# Patient Record
Sex: Female | Born: 1970
Health system: Southern US, Community
[De-identification: ages and names within clinical notes are randomized; demographics above are authoritative.]

## PROBLEM LIST (undated history)

## (undated) DIAGNOSIS — Z8669 Personal history of other diseases of the nervous system and sense organs: Secondary | ICD-10-CM

## (undated) DIAGNOSIS — N200 Calculus of kidney: Secondary | ICD-10-CM

## (undated) DIAGNOSIS — I10 Essential (primary) hypertension: Secondary | ICD-10-CM

## (undated) DIAGNOSIS — Z8619 Personal history of other infectious and parasitic diseases: Secondary | ICD-10-CM

## (undated) HISTORY — DX: Calculus of kidney: N20.0

## (undated) HISTORY — DX: Personal history of other infectious and parasitic diseases: Z86.19

## (undated) HISTORY — DX: Personal history of other diseases of the nervous system and sense organs: Z86.69

---

## 1994-02-20 HISTORY — PX: WRIST FRACTURE SURGERY: SHX121

## 2004-07-21 ENCOUNTER — Inpatient Hospital Stay: Payer: Self-pay

## 2004-10-19 ENCOUNTER — Emergency Department: Payer: Self-pay | Admitting: Emergency Medicine

## 2004-10-22 ENCOUNTER — Ambulatory Visit: Payer: Self-pay | Admitting: General Practice

## 2005-01-04 ENCOUNTER — Encounter: Payer: Self-pay | Admitting: General Practice

## 2005-01-20 ENCOUNTER — Encounter: Payer: Self-pay | Admitting: General Practice

## 2005-02-20 ENCOUNTER — Encounter: Payer: Self-pay | Admitting: General Practice

## 2006-01-04 ENCOUNTER — Ambulatory Visit: Payer: Self-pay | Admitting: Internal Medicine

## 2007-11-14 ENCOUNTER — Ambulatory Visit: Payer: Self-pay | Admitting: Internal Medicine

## 2007-11-29 ENCOUNTER — Other Ambulatory Visit: Payer: Self-pay

## 2007-11-29 ENCOUNTER — Emergency Department: Payer: Self-pay | Admitting: Emergency Medicine

## 2009-09-01 ENCOUNTER — Ambulatory Visit: Payer: Self-pay | Admitting: General Practice

## 2010-03-09 ENCOUNTER — Ambulatory Visit: Payer: Self-pay | Admitting: Internal Medicine

## 2010-03-24 ENCOUNTER — Ambulatory Visit: Payer: Self-pay | Admitting: Internal Medicine

## 2011-12-13 ENCOUNTER — Telehealth: Payer: Self-pay | Admitting: Internal Medicine

## 2011-12-13 NOTE — Telephone Encounter (Signed)
See if she can come in on 12/15/11 at 8:00.  Let me know if she thinks she can't wait until this time. I can see about working in tomorrow if needed.

## 2011-12-13 NOTE — Telephone Encounter (Signed)
Pt thinks she may have bladder infection and wants to know if she could come in sooner than her appointment ??

## 2011-12-14 NOTE — Telephone Encounter (Signed)
Patient called back this morning and she said that she feels as if it is a kidney stone, but she was okay with coming in on the 25th at 8:00 I have added her to the scheduled.

## 2011-12-15 ENCOUNTER — Encounter: Payer: Self-pay | Admitting: Internal Medicine

## 2011-12-15 ENCOUNTER — Ambulatory Visit (INDEPENDENT_AMBULATORY_CARE_PROVIDER_SITE_OTHER): Payer: BC Managed Care – PPO | Admitting: Internal Medicine

## 2011-12-15 VITALS — BP 118/70 | HR 67 | Temp 98.2°F | Ht 65.5 in | Wt 151.0 lb

## 2011-12-15 DIAGNOSIS — R109 Unspecified abdominal pain: Secondary | ICD-10-CM

## 2011-12-15 DIAGNOSIS — N926 Irregular menstruation, unspecified: Secondary | ICD-10-CM

## 2011-12-15 DIAGNOSIS — M549 Dorsalgia, unspecified: Secondary | ICD-10-CM

## 2011-12-15 LAB — POCT URINALYSIS DIPSTICK
Bilirubin, UA: NEGATIVE
Nitrite, UA: NEGATIVE
Protein, UA: NEGATIVE
Urobilinogen, UA: 1
pH, UA: 6

## 2011-12-15 LAB — CBC WITH DIFFERENTIAL/PLATELET
Basophils Absolute: 0 10*3/uL (ref 0.0–0.1)
Basophils Relative: 0.7 % (ref 0.0–3.0)
Eosinophils Absolute: 0.1 10*3/uL (ref 0.0–0.7)
MCHC: 33.5 g/dL (ref 30.0–36.0)
MCV: 84.4 fl (ref 78.0–100.0)
Monocytes Absolute: 0.4 10*3/uL (ref 0.1–1.0)
Neutrophils Relative %: 52 % (ref 43.0–77.0)
RBC: 4.73 Mil/uL (ref 3.87–5.11)
RDW: 13 % (ref 11.5–14.6)

## 2011-12-15 LAB — CREATININE, SERUM: Creatinine, Ser: 0.7 mg/dL (ref 0.4–1.2)

## 2011-12-15 NOTE — Patient Instructions (Addendum)
It was nice seeing you today.  I am sorry you have been having some problems.  I am going to send you urine for a culture.  Will await results before prescribing an antibiotic.  Tylenol as we discussed.  Stay hydrated.  Strainer for urine.  Keep me posted.

## 2011-12-15 NOTE — Progress Notes (Signed)
  Subjective:    Patient ID: Amanda Rojas, female    DOB: 1970-03-14, 41 y.o.   MRN: [id number]  HPI 41 year old female with past history of kidney stones who comes in today as a work in with concerns regarding low back pain and some lower abdominal pressure.  More pressure and discomfort noted on the left side.  Period started 12/11/11.  States occasionally she has some lower back pain prior to her menstrual period, but this usually resolves within a day.  This discomfort has persisted through the last several days.  Some discomfort in the lower abdomen also - more on the left side.  Not taking any pain meds.  Using heat to help.  No increased frequency or dysuria.  Having her period.  Unable to tell if any hematuria.  No fever or chills.  Eating and drinking.  No nausea or vomiting.  Denies possibility of being pregnant.  States husband has had a vasectomy.   Past Medical History  Diagnosis Date  . Chicken pox   . Kidney stones   . Migraines   . Urinary tract bacterial infections     Review of Systems No fever or chills.   Eating and drinking well.  No nausea or vomiting.  Back and lower abdominal pressure as outlined.  No dysuria or increased frequency.  No vaginal itching or discharge.          Objective:   Physical Exam Filed Vitals:   12/15/11 0807  BP: 118/70  Pulse: 67  Temp: 98.2 F (75.62 C)   41 year old female in no acute distress.  NECK:  Supple, nontender.   HEART:  Appears to be regular. LUNGS:  Without crackles or wheezing audible.  Respirations even and unlabored.   ABDOMEN:  Soft.  No significant tenderness to palpation.  Minimal discomfort (makes her feel like she has to urinate) - with palpation over the lower abdomen (LLQ more).                     Assessment & Plan:  GU.  With the pain as outlined, have to consider kidney stone as a possible etiology.  Urine dip reveals blood, but she is having her period.  Have her strain her urine and stay hydrated.  Will send  urine for culture to confirm no infection.  Hold on antibiotics.  No vaginal symptoms.  Follow.  Hold on xray or CT at this point.  (Pt wants to follow).    ANEMIA.  Check cbc and ferritin.

## 2011-12-17 ENCOUNTER — Encounter: Payer: Self-pay | Admitting: Internal Medicine

## 2011-12-20 ENCOUNTER — Telehealth: Payer: Self-pay | Admitting: *Deleted

## 2011-12-20 NOTE — Telephone Encounter (Signed)
Patient returned call and was given her lab results

## 2011-12-20 NOTE — Telephone Encounter (Signed)
Left message for patient to return call.

## 2011-12-20 NOTE — Progress Notes (Signed)
Patient given results

## 2012-01-15 ENCOUNTER — Ambulatory Visit (INDEPENDENT_AMBULATORY_CARE_PROVIDER_SITE_OTHER): Payer: BC Managed Care – PPO | Admitting: Internal Medicine

## 2012-01-15 ENCOUNTER — Encounter: Payer: Self-pay | Admitting: Internal Medicine

## 2012-01-15 VITALS — BP 120/84 | HR 84 | Temp 98.1°F | Ht 65.0 in | Wt 155.0 lb

## 2012-01-15 DIAGNOSIS — R319 Hematuria, unspecified: Secondary | ICD-10-CM

## 2012-01-15 DIAGNOSIS — N200 Calculus of kidney: Secondary | ICD-10-CM

## 2012-01-15 DIAGNOSIS — D509 Iron deficiency anemia, unspecified: Secondary | ICD-10-CM

## 2012-01-15 DIAGNOSIS — IMO0002 Reserved for concepts with insufficient information to code with codable children: Secondary | ICD-10-CM

## 2012-01-15 DIAGNOSIS — E611 Iron deficiency: Secondary | ICD-10-CM

## 2012-01-15 DIAGNOSIS — R87619 Unspecified abnormal cytological findings in specimens from cervix uteri: Secondary | ICD-10-CM

## 2012-01-15 DIAGNOSIS — R6889 Other general symptoms and signs: Secondary | ICD-10-CM

## 2012-01-15 LAB — URINALYSIS
Specific Gravity, Urine: >=1.03 (ref 1.000–1.030)
Total Protein, Urine: NEGATIVE
Urine Glucose: NEGATIVE

## 2012-01-15 LAB — POCT URINALYSIS DIPSTICK
Bilirubin, UA: NEGATIVE
Ketones, UA: NEGATIVE
Leukocytes, UA: NEGATIVE

## 2012-01-15 NOTE — Assessment & Plan Note (Signed)
Worked up by Urology ([facility]).  Currently asymptomatic.

## 2012-01-15 NOTE — Patient Instructions (Addendum)
It was good to see you again.  Let me know if you have any problems.

## 2012-01-15 NOTE — Assessment & Plan Note (Signed)
Previous positive HPV.  S/P cryosurgery.  Last pelvic/pap 03/15/11.  Obtain records.

## 2012-01-15 NOTE — Assessment & Plan Note (Signed)
Follow cbc.  Hgb just checked 10/13 - 13.4.  Follow.

## 2012-01-15 NOTE — Progress Notes (Signed)
  Subjective:    Patient ID: Amanda Rojas, female    DOB: 1970/12/02, 41 y.o.   MRN: [id number]  HPI 41 year old female with past history of menstrual migraines and positive HPV who comes in today for a scheduled follow up.  She states she is doing relatively well.  Still having some intermittent discomfort.  Notices more with soft drinks.  Pain localized on her left side and left hip.  Feels more in her bone.  Notices more when she has been sitting and goes to get up.  After moving around - better.  LMP two weeks ago.  Denies possibility of being pregnant.  Husband has had a vasectomy.  Some intermittent low back aching.  No dysuria.  No hematuria.  No abdominal pain.   Past Medical History  Diagnosis Date  . Hx of migraine headaches     menstrual migraines  . Kidney stones   . History of HPV infection     abnormal pap s/p cryosurgery     Review of Systems Patient denies any headache, lightheadedness or dizziness.  No sinus or allergy symptoms.   No chest pain, tightness or palpitations.  No increased shortness of breath, cough or congestion.  No nausea or vomiting.  No abdominal pain or cramping.  No bowel change, such as diarrhea, constipation, BRBPR or melana.  No urine change.        Objective:   Physical Exam Filed Vitals:   01/15/12 0811  BP: 120/84  Pulse: 84  Temp: 98.1 F (25.20 C)   41 year old female in no acute distress.   HEENT:  Nares - clear.  OP- without lesions or erythema.  NECK:  Supple, nontender.  No audible bruit.   HEART:  Appears to be regular. LUNGS:  Without crackles or wheezing audible.  Respirations even and unlabored.   RADIAL PULSE:  Equal bilaterally.  ABDOMEN:  Soft, nontender.  No audible abdominal bruit.  BACK:  No CVA tenderness.  No pain to palpation.    EXTREMITIES:  No increased edema to be present.  MSK.  No pain with rotation of her hips.  Negative straight leg raise.                     Assessment & Plan:  MSK.  Back/hip pain as  outlined.  Unclear as to the exact etiology.  Some of her symptoms appear to be more consistent with a msk origin.  Tylenol as directed.  She wants to hold on any further evaluation or treatment.  Exercises/stretching.  Notify me if symptoms change or worsen.    GYN.  Previous abnormal pap with positive HPV.  Pap 03/08/10 negative with negative HPV.  Do not have last years pap.  Obtain results.  Periods regular.  Follow.    IRON DEFICIENCY.  Follow cbc.  Most recent cbc wnl.  HEALTH MAINTENANCE.  Physical 03/15/11.  Schedule mammogram.  Cholesterol 1/12 revealed total cholesterol 142, triglycerides 57, HDL 43 and LDL 88.

## 2012-01-16 LAB — URINE CULTURE
Colony Count: NO GROWTH
Organism ID, Bacteria: NO GROWTH

## 2012-01-17 ENCOUNTER — Other Ambulatory Visit: Payer: Self-pay | Admitting: Internal Medicine

## 2012-01-17 DIAGNOSIS — R319 Hematuria, unspecified: Secondary | ICD-10-CM

## 2012-01-17 NOTE — Progress Notes (Signed)
Called and gave lab results to patient. Appointment scheduled.

## 2012-01-20 ENCOUNTER — Encounter: Payer: Self-pay | Admitting: Internal Medicine

## 2012-01-21 NOTE — Assessment & Plan Note (Signed)
Recent cbc wnl.  Follow.   

## 2012-01-21 NOTE — Assessment & Plan Note (Signed)
Previously worked up at [facility].  Symptoms as outlined.  Recheck urinalysis.  Discussed CT to confirm if stone is present.  She declines.  Wants to monitor.

## 2012-01-21 NOTE — Assessment & Plan Note (Signed)
Previous abnormal with positive HPV.  See above.  Will get her set up for her regular physical.  Pap 03/08/10 negative with negative HPV.

## 2012-02-08 ENCOUNTER — Telehealth: Payer: Self-pay | Admitting: Internal Medicine

## 2012-02-08 ENCOUNTER — Other Ambulatory Visit (INDEPENDENT_AMBULATORY_CARE_PROVIDER_SITE_OTHER): Payer: BC Managed Care – PPO

## 2012-02-08 DIAGNOSIS — R319 Hematuria, unspecified: Secondary | ICD-10-CM

## 2012-02-08 LAB — URINALYSIS, ROUTINE W REFLEX MICROSCOPIC
Nitrite: NEGATIVE
Urobilinogen, UA: 0.2 (ref 0.0–1.0)

## 2012-02-08 NOTE — Telephone Encounter (Signed)
Pt notified of labs via my chart messaging.  

## 2012-02-14 ENCOUNTER — Other Ambulatory Visit: Payer: BC Managed Care – PPO

## 2012-03-18 ENCOUNTER — Encounter: Payer: BC Managed Care – PPO | Admitting: Internal Medicine

## 2012-03-22 ENCOUNTER — Telehealth: Payer: Self-pay | Admitting: Internal Medicine

## 2012-03-22 NOTE — Telephone Encounter (Signed)
Left message for pt to call office.  Per dr Lorin Picket she needs to reschedule her cpx appointment

## 2012-03-26 NOTE — Telephone Encounter (Signed)
Appointment 4/10 @ 3:30 pt aware of appointment

## 2012-04-06 ENCOUNTER — Other Ambulatory Visit: Payer: Self-pay

## 2012-05-30 ENCOUNTER — Encounter: Payer: Self-pay | Admitting: Internal Medicine

## 2012-05-30 ENCOUNTER — Ambulatory Visit (INDEPENDENT_AMBULATORY_CARE_PROVIDER_SITE_OTHER): Payer: BC Managed Care – PPO | Admitting: Internal Medicine

## 2012-05-30 VITALS — BP 128/68 | HR 70 | Temp 98.3°F | Resp 18 | Wt 155.5 lb

## 2012-05-30 DIAGNOSIS — Z0000 Encounter for general adult medical examination without abnormal findings: Secondary | ICD-10-CM

## 2012-06-28 ENCOUNTER — Encounter: Payer: BC Managed Care – PPO | Admitting: Internal Medicine

## 2012-07-01 NOTE — Progress Notes (Signed)
Pt had to reschedule appt.   She had to go pick up her child.  Was not seen.

## 2012-12-13 ENCOUNTER — Other Ambulatory Visit: Payer: Self-pay | Admitting: Internal Medicine

## 2012-12-13 ENCOUNTER — Encounter: Payer: Self-pay | Admitting: Internal Medicine

## 2012-12-13 ENCOUNTER — Ambulatory Visit (INDEPENDENT_AMBULATORY_CARE_PROVIDER_SITE_OTHER): Payer: BC Managed Care – PPO | Admitting: Internal Medicine

## 2012-12-13 VITALS — BP 130/70 | HR 62 | Temp 98.0°F | Ht 65.0 in | Wt 153.2 lb

## 2012-12-13 DIAGNOSIS — M549 Dorsalgia, unspecified: Secondary | ICD-10-CM

## 2012-12-13 DIAGNOSIS — N200 Calculus of kidney: Secondary | ICD-10-CM

## 2012-12-13 DIAGNOSIS — N390 Urinary tract infection, site not specified: Secondary | ICD-10-CM

## 2012-12-13 LAB — POCT URINALYSIS DIPSTICK
Bilirubin, UA: NEGATIVE
Blood, UA: NEGATIVE
Nitrite, UA: NEGATIVE
Spec Grav, UA: 1.025
pH, UA: 7

## 2012-12-13 MED ORDER — ETODOLAC 400 MG PO TABS: 400.0000 mg | ORAL_TABLET | Freq: Two times a day (BID) | ORAL | Status: DC

## 2012-12-13 NOTE — Progress Notes (Signed)
Urine culture ordered

## 2012-12-14 LAB — CULTURE, URINE COMPREHENSIVE: Organism ID, Bacteria: NO GROWTH

## 2012-12-15 ENCOUNTER — Encounter: Payer: Self-pay | Admitting: Internal Medicine

## 2012-12-16 ENCOUNTER — Encounter: Payer: Self-pay | Admitting: Internal Medicine

## 2012-12-16 NOTE — Progress Notes (Signed)
  Subjective:    Patient ID: Amanda Rojas, female    DOB: 1970-12-12, 42 y.o.   MRN: [id number]  Back Pain  42 year old female with past history of menstrual migraines and positive HPV who comes in today as a work in with concerns regarding low back pain.  Present for approximately one week.  Describes as a constant aching.  Some burning pain.  No abdominal pressure or pain.  Some increased urinary frequency.  No hematuria.  No vaginal symptoms. Eating and drinking, but does report some decreased appetite.  No vomiting or nausea.  LMP two weeks ago.  Denies possibility of being pregnant.  Husband has had a vasectomy.  Bowels stable.  Feels similar to previous kidney stone flares.     Past Medical History  Diagnosis Date  . Hx of migraine headaches     menstrual migraines  . Kidney stones   . History of HPV infection     abnormal pap s/p cryosurgery     Review of Systems  Musculoskeletal: Positive for back pain.  Patient denies any headache, lightheadedness or dizziness.  No fever.  No sinus or allergy symptoms.   No chest pain, tightness or palpitations.  No increased shortness of breath, cough or congestion.  No nausea or vomiting.  No abdominal pain or cramping.  Low back pain as outlined.  No bowel change, such as diarrhea, constipation, BRBPR or melana.  Some increased urinary frequency.  No hematuria.  No dysuria.        Objective:   Physical Exam  Filed Vitals:   12/13/12 1030  BP: 130/70  Pulse: 62  Temp: 98 F (25.74 C)   42 year old female in no acute distress.   HEART:  Appears to be regular. LUNGS:  Without crackles or wheezing audible.  Respirations even and unlabored.   ABDOMEN:  Soft, nontender.  No audible abdominal bruit.  BACK:  No CVA tenderness.  No pain to palpation.    EXTREMITIES:  No increased edema to be present.                     Assessment & Plan:  GYN.  Previous abnormal pap with positive HPV.  Pap 03/08/10 negative with negative HPV.  Pap 03/15/11 -  negative.   Periods regular.  Follow.    HEALTH MAINTENANCE.  Physical 03/15/11.

## 2012-12-16 NOTE — Assessment & Plan Note (Signed)
Comes in with back pain and increased urinary frequency.  Feels similar to previous flares with kidney stones.  Continues to have intermittent flares.  Unclear etiology. Have her strain her urine.  Stay hydrated.  Lodine given for pain.  She is driving this weekend and cannot have anything stronger.  Have her f/u with Dr Achilles Dunk for further evaluation.  Hold on scanning until his review.  Any worsening or change in symptoms, she is to be reevaluated.

## 2012-12-17 NOTE — Telephone Encounter (Signed)
Mailed unread message to pt  

## 2013-04-25 ENCOUNTER — Ambulatory Visit: Payer: Self-pay | Admitting: Internal Medicine

## 2013-04-25 LAB — HM MAMMOGRAPHY

## 2013-04-28 ENCOUNTER — Encounter: Payer: Self-pay | Admitting: Internal Medicine

## 2013-05-05 ENCOUNTER — Ambulatory Visit: Payer: Self-pay | Admitting: Internal Medicine

## 2013-05-05 LAB — HM MAMMOGRAPHY

## 2013-05-06 ENCOUNTER — Encounter: Payer: Self-pay | Admitting: Internal Medicine

## 2013-05-26 ENCOUNTER — Encounter: Payer: Self-pay | Admitting: Internal Medicine

## 2013-05-26 DIAGNOSIS — R928 Other abnormal and inconclusive findings on diagnostic imaging of breast: Secondary | ICD-10-CM | POA: Insufficient documentation

## 2013-06-24 ENCOUNTER — Ambulatory Visit (INDEPENDENT_AMBULATORY_CARE_PROVIDER_SITE_OTHER): Payer: BC Managed Care – PPO | Admitting: Internal Medicine

## 2013-06-24 ENCOUNTER — Other Ambulatory Visit (HOSPITAL_COMMUNITY)
Admission: RE | Admit: 2013-06-24 | Discharge: 2013-06-24 | Disposition: A | Discharge status: Home / Self Care | Payer: BC Managed Care – PPO | Source: Ambulatory Visit | Attending: Internal Medicine | Admitting: Internal Medicine

## 2013-06-24 ENCOUNTER — Encounter: Payer: Self-pay | Admitting: Internal Medicine

## 2013-06-24 VITALS — HR 76 | Temp 98.5°F | Ht 65.5 in | Wt 144.0 lb

## 2013-06-24 DIAGNOSIS — Z01419 Encounter for gynecological examination (general) (routine) without abnormal findings: Secondary | ICD-10-CM | POA: Insufficient documentation

## 2013-06-24 DIAGNOSIS — Z13220 Encounter for screening for lipoid disorders: Secondary | ICD-10-CM

## 2013-06-24 DIAGNOSIS — Z1151 Encounter for screening for human papillomavirus (HPV): Secondary | ICD-10-CM | POA: Insufficient documentation

## 2013-06-24 DIAGNOSIS — N200 Calculus of kidney: Secondary | ICD-10-CM

## 2013-06-24 DIAGNOSIS — N8111 Cystocele, midline: Secondary | ICD-10-CM

## 2013-06-24 DIAGNOSIS — R928 Other abnormal and inconclusive findings on diagnostic imaging of breast: Secondary | ICD-10-CM

## 2013-06-24 DIAGNOSIS — N8110 Cystocele, unspecified: Secondary | ICD-10-CM

## 2013-06-24 DIAGNOSIS — Z124 Encounter for screening for malignant neoplasm of cervix: Secondary | ICD-10-CM

## 2013-06-24 DIAGNOSIS — K649 Unspecified hemorrhoids: Secondary | ICD-10-CM

## 2013-06-24 DIAGNOSIS — E611 Iron deficiency: Secondary | ICD-10-CM

## 2013-06-24 DIAGNOSIS — D509 Iron deficiency anemia, unspecified: Secondary | ICD-10-CM

## 2013-06-24 MED ORDER — HYDROCORTISONE ACE-PRAMOXINE 1-1 % RE CREA: 1.0000 "application " | TOPICAL_CREAM | Freq: Two times a day (BID) | RECTAL | Status: DC

## 2013-06-24 NOTE — Progress Notes (Signed)
Pre visit review using our clinic review tool, if applicable. No additional management support is needed unless otherwise documented below in the visit note. 

## 2013-06-25 ENCOUNTER — Encounter: Payer: Self-pay | Admitting: Internal Medicine

## 2013-06-25 DIAGNOSIS — N8110 Cystocele, unspecified: Secondary | ICD-10-CM | POA: Insufficient documentation

## 2013-06-25 DIAGNOSIS — K649 Unspecified hemorrhoids: Secondary | ICD-10-CM | POA: Insufficient documentation

## 2013-06-25 NOTE — Assessment & Plan Note (Signed)
See above.  05/05/13 follow views - Birads II.

## 2013-06-25 NOTE — Assessment & Plan Note (Signed)
Has not had problems recently.  Previously had referred her to urology.  Never evaluated.  No problems now.  Wants to follow.

## 2013-06-25 NOTE — Assessment & Plan Note (Signed)
Previous abnormal with positive HPV.  Pap 03/08/10 negative with negative HPV.  Repeat pap today.

## 2013-06-25 NOTE — Assessment & Plan Note (Signed)
analpram as directed.  Follow.

## 2013-06-25 NOTE — Assessment & Plan Note (Signed)
Recheck cbc.  

## 2013-06-25 NOTE — Progress Notes (Signed)
  Subjective:    Patient ID: Amanda Rojas, female    DOB: 07-May-1970, 43 y.o.   MRN: [id number]  HPI 43 year old female with past history of kidney stones who comes in today for her physical exam.  Taking claritin for her allergies.  Helping.   No fever or chills.  Eating and drinking.  No nausea or vomiting.  Denies possibility of being pregnant.  States husband has had a vasectomy.  She is exercising.  Watching what she eats.  Has lost some weight.  States her last period was last week.  Regular.  Occurring every three - four weeks.  Some heavy clotting on her first day.  She is having problems with hemorrhoid.  Some itching.  No bleeding.  She is concerned regarding her bladder.  Concern about "it dropping".     Past Medical History  Diagnosis Date  . Hx of migraine headaches     menstrual migraines  . Kidney stones   . History of HPV infection     abnormal pap s/p cryosurgery    Review of Systems No fever or chills.  Allergies controlled with claritin.  Eating and drinking well.  No nausea or vomiting.  Bowels stable.  Some problems with hemorrhoids.  Adjusted her diet.  Exercising.  Losing weight.    No vaginal itching or discharge. Periods as outlined.           Objective:   Physical Exam  Filed Vitals:   06/24/13 0937  Pulse: 76  Temp: 98.5 F (7536.549 C)   43 year old female in no acute distress.   HEENT:  Nares- clear.  Oropharynx - without lesions. NECK:  Supple.  Nontender.  No audible bruit.  HEART:  Appears to be regular. LUNGS:  No crackles or wheezing audible.  Respirations even and unlabored.  RADIAL PULSE:  Equal bilaterally.    BREASTS:  No nipple discharge or nipple retraction present.  Could not appreciate any distinct nodules or axillary adenopathy.  ABDOMEN:  Soft, nontender.  Bowel sounds present and normal.  No audible abdominal bruit.  GU:  Normal external genitalia.  Vaginal vault without lesions.  Cervix identified.  Pap performed. Could not appreciate any  adnexal masses or tenderness.  Some prolapse with bearing down.  RECTAL:  Heme negative.  External hemorrhoid.    EXTREMITIES:  No increased edema present.  DP pulses palpable and equal bilaterally.                    Assessment & Plan:  HEALTH MAINTENANCE.  Physical today.  Check routine labs.   Had mammogram recently.  Need results.   I spent 25 minutes with the patient and more than 50% of the time was spent in consultation regarding the above.

## 2013-06-25 NOTE — Assessment & Plan Note (Signed)
Some weakness in the muscle.  Pt concerned about symptoms.  Bothers her.  After discussion, it was decided to refer her to gyn for further evaluation and treatment.

## 2013-06-27 ENCOUNTER — Encounter: Payer: Self-pay | Admitting: Internal Medicine

## 2013-06-30 NOTE — Telephone Encounter (Signed)
Unread mychart message mailed to patient 

## 2013-07-02 ENCOUNTER — Encounter: Payer: Self-pay | Admitting: Internal Medicine

## 2014-06-17 ENCOUNTER — Ambulatory Visit
Admit: 2014-06-17 | Disposition: A | Discharge status: Home / Self Care | Payer: Self-pay | Attending: Internal Medicine | Admitting: Internal Medicine

## 2014-06-23 ENCOUNTER — Encounter: Payer: Self-pay | Admitting: Internal Medicine

## 2014-06-26 ENCOUNTER — Encounter: Payer: BC Managed Care – PPO | Admitting: Internal Medicine

## 2014-07-21 ENCOUNTER — Encounter: Payer: Self-pay | Admitting: Internal Medicine

## 2014-10-16 ENCOUNTER — Encounter: Payer: Self-pay | Admitting: Internal Medicine

## 2014-10-16 ENCOUNTER — Ambulatory Visit (INDEPENDENT_AMBULATORY_CARE_PROVIDER_SITE_OTHER): Payer: BLUE CROSS/BLUE SHIELD | Admitting: Internal Medicine

## 2014-10-16 VITALS — BP 110/78 | HR 61 | Temp 98.3°F | Ht 65.8 in | Wt 139.5 lb

## 2014-10-16 DIAGNOSIS — E611 Iron deficiency: Secondary | ICD-10-CM

## 2014-10-16 DIAGNOSIS — N63 Unspecified lump in unspecified breast: Secondary | ICD-10-CM

## 2014-10-16 DIAGNOSIS — R5383 Other fatigue: Secondary | ICD-10-CM

## 2014-10-16 DIAGNOSIS — N200 Calculus of kidney: Secondary | ICD-10-CM

## 2014-10-16 DIAGNOSIS — Z0001 Encounter for general adult medical examination with abnormal findings: Secondary | ICD-10-CM

## 2014-10-16 DIAGNOSIS — N8110 Cystocele, unspecified: Secondary | ICD-10-CM | POA: Diagnosis not present

## 2014-10-16 DIAGNOSIS — Z0000 Encounter for general adult medical examination without abnormal findings: Secondary | ICD-10-CM

## 2014-10-16 DIAGNOSIS — R87619 Unspecified abnormal cytological findings in specimens from cervix uteri: Secondary | ICD-10-CM

## 2014-10-16 DIAGNOSIS — Z13220 Encounter for screening for lipoid disorders: Secondary | ICD-10-CM | POA: Diagnosis not present

## 2014-10-16 LAB — TSH: TSH: 0.72 u[IU]/mL (ref 0.35–4.50)

## 2014-10-16 LAB — COMPREHENSIVE METABOLIC PANEL
ALBUMIN: 4.2 g/dL (ref 3.5–5.2)
ALK PHOS: 51 U/L (ref 39–117)
ALT: 9 U/L (ref 0–35)
AST: 14 U/L (ref 0–37)
BILIRUBIN TOTAL: 0.6 mg/dL (ref 0.2–1.2)
BUN: 11 mg/dL (ref 6–23)
CALCIUM: 9.2 mg/dL (ref 8.4–10.5)
CO2: 29 mEq/L (ref 19–32)
Chloride: 105 mEq/L (ref 96–112)
Creatinine, Ser: 0.63 mg/dL (ref 0.40–1.20)
GFR: 108.31 mL/min (ref >60.00)
GLUCOSE: 92 mg/dL (ref 70–99)
POTASSIUM: 4.4 meq/L (ref 3.5–5.1)
Sodium: 139 mEq/L (ref 135–145)
TOTAL PROTEIN: 6.7 g/dL (ref 6.0–8.3)

## 2014-10-16 LAB — LIPID PANEL
CHOL/HDL RATIO: 3
Cholesterol: 125 mg/dL (ref 0–200)
HDL: 47.3 mg/dL (ref >39.00)
LDL Cholesterol: 70 mg/dL (ref 0–99)
NonHDL: 77.73
TRIGLYCERIDES: 41 mg/dL (ref 0.0–149.0)
VLDL: 8.2 mg/dL (ref 0.0–40.0)

## 2014-10-16 LAB — CBC WITH DIFFERENTIAL/PLATELET
BASOS ABS: 0 10*3/uL (ref 0.0–0.1)
Basophils Relative: 0.4 % (ref 0.0–3.0)
EOS PCT: 0.7 % (ref 0.0–5.0)
Eosinophils Absolute: 0 10*3/uL (ref 0.0–0.7)
HEMATOCRIT: 39.5 % (ref 36.0–46.0)
HEMOGLOBIN: 13.5 g/dL (ref 12.0–15.0)
LYMPHS ABS: 1.6 10*3/uL (ref 0.7–4.0)
LYMPHS PCT: 27.2 % (ref 12.0–46.0)
MCHC: 34.1 g/dL (ref 30.0–36.0)
MCV: 84.8 fl (ref 78.0–100.0)
MONOS PCT: 6.5 % (ref 3.0–12.0)
Monocytes Absolute: 0.4 10*3/uL (ref 0.1–1.0)
NEUTROS PCT: 65.2 % (ref 43.0–77.0)
Neutro Abs: 3.7 10*3/uL (ref 1.4–7.7)
Platelets: 219 10*3/uL (ref 150.0–400.0)
RBC: 4.66 Mil/uL (ref 3.87–5.11)
RDW: 13.1 % (ref 11.5–15.5)
WBC: 5.7 10*3/uL (ref 4.0–10.5)

## 2014-10-16 LAB — FERRITIN: Ferritin: 25 ng/mL (ref 10.0–291.0)

## 2014-10-16 NOTE — Progress Notes (Signed)
Pre-visit discussion using our clinic review tool. No additional management support is needed unless otherwise documented below in the visit note.  

## 2014-10-16 NOTE — Progress Notes (Signed)
Patient ID: Amanda Rojas, female   DOB: 02-14-1971, 44 y.o.   MRN: [id number]   Subjective:    Patient ID: Amanda Rojas, female    DOB: 04/05/1970, 44 y.o.   MRN: [id number]  HPI  Patient here to follow up on her current medical issues as well as for her physical exam.  She had previous issues with her bladder.  Did not make her urology appt previously.  Ready to reschedule.  Has a history of nephrolithiasis and bladder prolapse.  Reports some fatigue.  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  Bowels are better.  Taking a probiotic and taking miralax.  Periods regular.  Reports persistent nodule in left breast.  No nipple discharge.  No pain.  Has adjusted her diet.  Lost weight.  Eating and drinking well.  Stays active.  No cardiac symptoms with increased activity or exertion.  No sob.     Past Medical History  Diagnosis Date  . Hx of migraine headaches     menstrual migraines  . Kidney stones   . History of HPV infection     abnormal pap s/p cryosurgery   No past surgical history on file. Family History  Problem Relation Age of Onset  . Arthritis Mother   . Heart disease Maternal Grandmother   . Thyroid disease Mother   . Breast cancer      cousin, subsequently developed acute leukemia  . Colon cancer Neg Hx    Social History   Social History  . Marital Status: Married    Spouse Name: N/A  . Number of Children: 3  . Years of Education: N/A   Occupational History  .     Social History Main Topics  . Smoking status: Never Smoker   . Smokeless tobacco: Never Used  . Alcohol Use: No  . Drug Use: No  . Sexual Activity: Not Asked   Other Topics Concern  . None   Social History Narrative    Outpatient Encounter Prescriptions as of 10/16/2014  Medication Sig  . ferrous sulfate 325 (65 FE) MG tablet Take 325 mg by mouth daily with breakfast.  . Probiotic Product (PROBIOTIC PO) Take by mouth.  . [DISCONTINUED]  pramoxine-hydrocortisone (ANALPRAM-HC) 1-1 % rectal cream Place 1 application rectally 2 (two) times daily.   No facility-administered encounter medications on file as of 10/16/2014.    Review of Systems  Constitutional: Negative for appetite change and unexpected weight change (has adjusted her diet and lost weight.  ).  HENT: Negative for congestion and sinus pressure.   Eyes: Negative for pain and visual disturbance.  Respiratory: Negative for cough, chest tightness and shortness of breath.   Cardiovascular: Negative for chest pain, palpitations and leg swelling.  Gastrointestinal: Negative for nausea, vomiting, abdominal pain and diarrhea.  Genitourinary: Negative for dysuria and difficulty urinating.  Musculoskeletal: Negative for back pain and joint swelling.  Skin: Negative for color change and rash.  Neurological: Negative for dizziness, light-headedness and headaches.  Hematological: Negative for adenopathy. Does not bruise/bleed easily.  Psychiatric/Behavioral: Negative for dysphoric mood and agitation.       Objective:    Physical Exam  Constitutional: She appears well-developed and well-nourished. No distress.  HENT:  Nose: Nose normal.  Mouth/Throat: Oropharynx is clear and moist.  Eyes: Conjunctivae are normal. Right eye exhibits no discharge. Left eye exhibits no discharge.  Neck: Neck supple. No thyromegaly present.  Cardiovascular: Normal rate and  regular rhythm.   Pulmonary/Chest: Breath sounds normal. No respiratory distress. She has no wheezes.  Breast exam reveals no nipple discharge or nipple retraction present.  Palpable nodule 1-2:00 left breast.  No other palpable nodules or axillary adenopathy.    Abdominal: Soft. Bowel sounds are normal. There is no tenderness.  Musculoskeletal: She exhibits no edema or tenderness.  Lymphadenopathy:    She has no cervical adenopathy.  Skin: No rash noted. No erythema.  Psychiatric: She has a normal mood and affect. Her  behavior is normal.    BP 110/78 mmHg  Pulse 61  Temp(Src) 98.3 F (36.8 C) (Oral)  Ht 5' 5.8" (1.671 m)  Wt 139 lb 8 oz (63.277 kg)  BMI 22.66 kg/m2  SpO2 98%  LMP 09/29/2014 (Exact Date) Wt Readings from Last 3 Encounters:  10/16/14 139 lb 8 oz (63.277 kg)  06/24/13 144 lb (65.318 kg)  12/13/12 153 lb 4 oz (69.514 kg)     Lab Results  Component Value Date   WBC 5.7 10/16/2014   HGB 13.5 10/16/2014   HCT 39.5 10/16/2014   PLT 219.0 10/16/2014   GLUCOSE 92 10/16/2014   CHOL 125 10/16/2014   TRIG 41.0 10/16/2014   HDL 47.30 10/16/2014   LDLCALC 70 10/16/2014   ALT 9 10/16/2014   AST 14 10/16/2014   NA 139 10/16/2014   K 4.4 10/16/2014   CL 105 10/16/2014   CREATININE 0.63 10/16/2014   BUN 11 10/16/2014   CO2 29 10/16/2014   TSH 0.72 10/16/2014    Mm Screening Breast Tomo Bilateral  06/18/2014   CLINICAL DATA:  Screening.  EXAM: DIGITAL SCREENING BILATERAL MAMMOGRAM WITH 3D TOMO WITH CAD  COMPARISON:  Previous exam(s).  ACR Breast Density Category d: The breast tissue is extremely dense, which lowers the sensitivity of mammography.  FINDINGS: There are no findings suspicious for malignancy. Images were processed with CAD.  IMPRESSION: No mammographic evidence of malignancy. A result letter of this screening mammogram will be mailed directly to the patient.  RECOMMENDATION: Screening mammogram in one year. (Code:SM-B-01Y)  BI-RADS CATEGORY  1: Negative.   Electronically Signed   By: Amie Portland M.D.   On: 06/18/2014 08:35       Assessment & Plan:   Problem List Items Addressed This Visit    Abnormal Pap smear of cervix    Previous abnormal pap with positive HPV.  PAP 07/04/13 negative with negative HPV.       Bladder prolapse, female, acquired    Some weakness in the muscle.  Bothers her.  Refer to urology for evaluation of this and given her history of kidney stones.        Relevant Orders   Ambulatory referral to Urology   Breast nodule    Persistent  palpable breast nodule as outlined.  Mammogram 06/18/14 - birads I.  She has concerns about the persistent palpable area.  Refer to Dr Lemar Livings for evaluation.        Relevant Orders   Ambulatory referral to General Surgery   Fatigue - Primary    Feels related to working two jobs.  Check routine labs as outlined.        Relevant Orders   Comprehensive metabolic panel (Completed)   TSH (Completed)   Health care maintenance    Physical 10/16/14.  PAP 06/24/13 - negative with negative HPV.  Mammogram 06/18/14 - Birads I.       Iron deficiency    Check cbc and ferritin.  Relevant Orders   CBC with Differential/Platelet (Completed)   Ferritin (Completed)   Nephrolithiasis    History of kidney stones.  She request referral now to urology.  Has the bladder issues as outlined.  Refer to urology.       Relevant Orders   Ambulatory referral to Urology    Other Visit Diagnoses    Screening cholesterol level        Relevant Orders    Lipid panel (Completed)        Dale Concord, MD

## 2014-10-17 ENCOUNTER — Encounter: Payer: Self-pay | Admitting: Internal Medicine

## 2014-10-19 ENCOUNTER — Encounter: Payer: Self-pay | Admitting: Internal Medicine

## 2014-10-19 DIAGNOSIS — Z0000 Encounter for general adult medical examination without abnormal findings: Secondary | ICD-10-CM | POA: Insufficient documentation

## 2014-10-19 DIAGNOSIS — N63 Unspecified lump in unspecified breast: Secondary | ICD-10-CM | POA: Insufficient documentation

## 2014-10-19 NOTE — Assessment & Plan Note (Signed)
Check cbc and ferritin 

## 2014-10-19 NOTE — Assessment & Plan Note (Signed)
Some weakness in the muscle.  Bothers her.  Refer to urology for evaluation of this and given her history of kidney stones.

## 2014-10-19 NOTE — Assessment & Plan Note (Signed)
Feels related to working two jobs.  Check routine labs as outlined.

## 2014-10-19 NOTE — Assessment & Plan Note (Signed)
History of kidney stones.  She request referral now to urology.  Has the bladder issues as outlined.  Refer to urology.

## 2014-10-19 NOTE — Assessment & Plan Note (Signed)
Persistent palpable breast nodule as outlined.  Mammogram 06/18/14 - birads I.  She has concerns about the persistent palpable area.  Refer to Dr Lemar Livings for evaluation.

## 2014-10-19 NOTE — Assessment & Plan Note (Signed)
Previous abnormal pap with positive HPV.  PAP 07/04/13 negative with negative HPV.

## 2014-10-19 NOTE — Assessment & Plan Note (Signed)
Physical 10/16/14.  PAP 06/24/13 - negative with negative HPV.  Mammogram 06/18/14 - Birads I.

## 2014-10-21 ENCOUNTER — Ambulatory Visit (INDEPENDENT_AMBULATORY_CARE_PROVIDER_SITE_OTHER): Payer: BLUE CROSS/BLUE SHIELD | Admitting: General Surgery

## 2014-10-21 ENCOUNTER — Encounter: Payer: Self-pay | Admitting: General Surgery

## 2014-10-21 VITALS — BP 120/78 | HR 76 | Resp 14 | Ht 65.0 in | Wt 139.4 lb

## 2014-10-21 DIAGNOSIS — N6002 Solitary cyst of left breast: Secondary | ICD-10-CM

## 2014-10-21 DIAGNOSIS — N6009 Solitary cyst of unspecified breast: Secondary | ICD-10-CM | POA: Insufficient documentation

## 2014-10-21 NOTE — Patient Instructions (Signed)
Follow up as needed.  Continue self breast exams. Call office for any new breast issues or concerns.  

## 2014-10-21 NOTE — Progress Notes (Signed)
Patient ID: Amanda Rojas, female   DOB: Jul 06, 1970, 44 y.o.   MRN: [id number]  Chief Complaint  Patient presents with  . Other    left breast nodule    HPI Amanda Rojas is a 44 y.o. female here for evaluation of a palpable left breast nodule. Her last mammogram was on 06/18/14 [facility]. She first noticed this last year before her mammogram. Her mammogram and ultrasound was normal last year. She reports that at times the area seems achy and may be associated with her menstrual cycle and increased caffeine consumption. She reports that her monthly symptoms for her menses have worsened in the last 2-3 years. She reports that her nipples have felt irritated in the last few months. She reports that she has experienced some modest increase in her breast size with age. This has not prompted the purchase of bras.  HPI  Past Medical History  Diagnosis Date  . Hx of migraine headaches     menstrual migraines  . History of HPV infection     abnormal pap s/p cryosurgery  . Kidney stones     Past Surgical History  Procedure Laterality Date  . Wrist fracture surgery  1996    metal plate placed    Family History  Problem Relation Age of Onset  . Arthritis Mother   . Heart disease Maternal Grandmother   . Thyroid disease Mother   . Breast cancer Cousin 63    subsequently developed acute leukemia    Social History Social History  Substance Use Topics  . Smoking status: Never Smoker   . Smokeless tobacco: Never Used  . Alcohol Use: 0.0 oz/week    0 Standard drinks or equivalent per week    Allergies  Allergen Reactions  . Percocet [Oxycodone-Acetaminophen] Itching and Nausea Only    Current Outpatient Prescriptions  Medication Sig Dispense Refill  . ferrous sulfate 325 (65 FE) MG tablet Take 325 mg by mouth daily with breakfast.    . Probiotic Product (PROBIOTIC PO) Take by mouth.     No current facility-administered medications for this visit.    Review of Systems Review  of Systems  Constitutional: Negative.   Respiratory: Negative.   Cardiovascular: Negative.     Blood pressure 120/78, pulse 76, resp. rate 14, height  (1.651 m), weight 139 lb 6.4 oz (63.231 kg), last menstrual period 09/29/2014.  Physical Exam Physical Exam  Constitutional: She is oriented to person, place, and time. She appears well-developed and well-nourished.  Eyes: Conjunctivae are normal. No scleral icterus.  Neck: Neck supple.  Cardiovascular: Normal rate, regular rhythm and normal heart sounds.   Pulmonary/Chest: Effort normal and breath sounds normal. Right breast exhibits no inverted nipple, no mass, no nipple discharge, no skin change and no tenderness. Left breast exhibits mass (2.5 cm mobile mass 2 o'clk 12 cm from the nipple). Left breast exhibits no inverted nipple, no nipple discharge, no skin change and no tenderness.    Lymphadenopathy:    She has no cervical adenopathy.    She has no axillary adenopathy.  Neurological: She is alert and oriented to person, place, and time.  Skin: Skin is warm and dry.  Psychiatric: She has a normal mood and affect.    Data Reviewed 05/05/2013 left breast ultrasound confirmed a cyst corresponding to the palpable lesion.  06/16/2014 bilateral screening mammograms showed no interval change.  Assessment    Asymptomatic breast cyst. Nipple sensitivity likely secondary to irritation from her bra.  Plan    No interventions required for the breast cyst at present. Should she develop new lesion she was encouraged to report promptly for reassessment.  Caffeine in moderation is reasonable. She is noted to correlation between her menses and increased caffeine consumption with increasing breast discomfort. It's unlikely that she'll be willing to go caffeine free, but less may be of benefit.    PCP: Letta Median 10/21/2014, 4:11 PM

## 2014-11-10 ENCOUNTER — Ambulatory Visit: Payer: Self-pay

## 2014-11-11 ENCOUNTER — Ambulatory Visit: Payer: Self-pay

## 2014-11-16 ENCOUNTER — Encounter: Payer: Self-pay | Admitting: Urology

## 2014-11-16 ENCOUNTER — Ambulatory Visit (INDEPENDENT_AMBULATORY_CARE_PROVIDER_SITE_OTHER): Payer: BLUE CROSS/BLUE SHIELD | Admitting: Urology

## 2014-11-16 VITALS — BP 126/79 | HR 71 | Ht 65.5 in | Wt 138.3 lb

## 2014-11-16 DIAGNOSIS — N200 Calculus of kidney: Secondary | ICD-10-CM | POA: Diagnosis not present

## 2014-11-16 DIAGNOSIS — N393 Stress incontinence (female) (male): Secondary | ICD-10-CM

## 2014-11-16 DIAGNOSIS — N8110 Cystocele, unspecified: Secondary | ICD-10-CM | POA: Diagnosis not present

## 2014-11-16 DIAGNOSIS — R351 Nocturia: Secondary | ICD-10-CM

## 2014-11-16 LAB — MICROSCOPIC EXAMINATION
RBC MICROSCOPIC, UA: NONE SEEN /HPF (ref 0–?)
WBC UA: NONE SEEN /HPF (ref 0–?)

## 2014-11-16 LAB — URINALYSIS, COMPLETE
BILIRUBIN UA: NEGATIVE
Glucose, UA: NEGATIVE
KETONES UA: NEGATIVE
Leukocytes, UA: NEGATIVE
NITRITE UA: NEGATIVE
Protein, UA: NEGATIVE
RBC UA: NEGATIVE
SPEC GRAV UA: 1.015 (ref 1.005–1.030)
Urobilinogen, Ur: 0.2 mg/dL (ref 0.2–1.0)
pH, UA: 7 (ref 5.0–7.5)

## 2014-11-16 LAB — BLADDER SCAN AMB NON-IMAGING

## 2014-11-16 NOTE — Progress Notes (Signed)
11/16/2014 11:15 AM   Amanda Rojas [birth year] [id number]  Referring provider: Dale Lonepine, MD [address] [place], [place] [address]  Chief Complaint  Patient presents with  . Bladder Prolapse    New Patient    HPI: The patient is a 44 year old woman would like to establish ongoing stone care here locally. She has passed kidney stones but not had previous GU surgery. Her urologist moved away locally. She wonders of her bladder is dropped. Sometimes she'll leak a little bit with a sneeze unless she crosses her legs. I'm not convinced that she has vaginal bulging sensation. She is otherwise continent. She gets up at least once a night and voids every 2-3 hours during the daytime.  She has no neurologic risk factor symptoms. She has not had a hysterectomy. Her bowel movements are normal  Modifying factors: There are no other modifying factors  Associated signs and symptoms: There are no other associated signs and symptoms Aggravating and relieving factors: There are no other aggravating or relieving factors Severity: Mild Duration: Persistent  over 1 year PMH: Past Medical History  Diagnosis Date  . Hx of migraine headaches     menstrual migraines  . History of HPV infection     abnormal pap s/p cryosurgery  . Kidney stones     Surgical History: Past Surgical History  Procedure Laterality Date  . Wrist fracture surgery  1996    metal plate placed    Home Medications:    Medication List       This list is accurate as of: 11/16/14 11:15 AM.  Always use your most recent med list.               ferrous sulfate 325 (65 FE) MG tablet  Take 325 mg by mouth daily with breakfast.     PROBIOTIC PO  Take by mouth.        Allergies:  Allergies  Allergen Reactions  . Percocet [Oxycodone-Acetaminophen] Itching and Nausea Only    Family History: Family History  Problem Relation Age of Onset  . Arthritis Mother   . Heart disease  Maternal Grandmother   . Thyroid disease Mother   . Breast cancer Cousin 37    subsequently developed acute leukemia  . Prostate cancer Neg Hx   . Bladder Cancer Neg Hx     Social History:  reports that she has never smoked. She has never used smokeless tobacco. She reports that she drinks alcohol. She reports that she does not use illicit drugs.  ROS: UROLOGY Frequent Urination?: Yes Hard to postpone urination?: No Burning/pain with urination?: No Get up at night to urinate?: Yes Leakage of urine?: Yes Urine stream starts and stops?: Yes Trouble starting stream?: No Do you have to strain to urinate?: No Blood in urine?: No Urinary tract infection?: No Sexually transmitted disease?: No Injury to kidneys or bladder?: No Painful intercourse?: Yes Weak stream?: No Currently pregnant?: No Vaginal bleeding?: No Last menstrual period?: 10-28-14  Gastrointestinal Nausea?: No Vomiting?: No Indigestion/heartburn?: No Diarrhea?: No Constipation?: No  Constitutional Fever: No Night sweats?: No Weight loss?: No Fatigue?: No  Skin Skin rash/lesions?: No Itching?: No  Eyes Blurred vision?: No Double vision?: No  Ears/Nose/Throat Sore throat?: No Sinus problems?: No  Hematologic/Lymphatic Swollen glands?: No Easy bruising?: No  Cardiovascular Leg swelling?: No Chest pain?: No  Respiratory Cough?: No Shortness of breath?: No  Endocrine Excessive thirst?: No  Musculoskeletal Back pain?: No Joint pain?: No  Neurological  Headaches?: No Dizziness?: No  Psychologic Depression?: No Anxiety?: No  Physical Exam: BP 126/79 mmHg  Pulse 71  Ht 5' 5.5" (1.664 m)  Wt 138 lb 4.8 oz (62.732 kg)  BMI 22.66 kg/m2  LMP 10/28/2014  Constitutional:  Alert and oriented, No acute distress. HEENT: [place] AT, moist mucus membranes.  Trachea midline, no masses. Cardiovascular: No clubbing, cyanosis, or edema. Respiratory: Normal respiratory effort, no increased work of  breathing. GI: Abdomen is soft, nontender, nondistended, no abdominal masses GU: No CVA tenderness. Moderate grade 2 hyper mobility of the bladder neck. Grade 1 or small grade 2 cystocele more associated with rotational descent and only associated with a small central defect. At rest she had minimal prolapse. She almost had a cystourethrocele Skin: No rashes, bruises or suspicious lesions. Lymph: No cervical or inguinal adenopathy. Neurologic: Grossly intact, no focal deficits, moving all 4 extremities. Psychiatric: Normal mood and affect.  Laboratory Data: Lab Results  Component Value Date   WBC 5.7 10/16/2014   HGB 13.5 10/16/2014   HCT 39.5 10/16/2014   MCV 84.8 10/16/2014   PLT 219.0 10/16/2014    Lab Results  Component Value Date   CREATININE 0.63 10/16/2014    No results found for: PSA  No results found for: TESTOSTERONE  No results found for: HGBA1C  Urinalysis    Component Value Date/Time   COLORURINE YELLOW 02/08/2012 0858   APPEARANCEUR CLEAR 02/08/2012 0858   LABSPEC >=1.030 02/08/2012 0858   PHURINE 6.0 02/08/2012 0858   GLUCOSEU NEGATIVE 02/08/2012 0858   HGBUR TRACE-INTACT 02/08/2012 0858   BILIRUBINUR neg 12/13/2012 1039   BILIRUBINUR NEGATIVE 02/08/2012 0858   KETONESUR NEGATIVE 02/08/2012 0858   PROTEINUR neg 12/13/2012 1039   UROBILINOGEN 1.0 12/13/2012 1039   UROBILINOGEN 0.2 02/08/2012 0858   NITRITE neg 12/13/2012 1039   NITRITE NEGATIVE 02/08/2012 0858   LEUKOCYTESUR Negative 12/13/2012 1039    Pertinent Imaging: Bladder scan residual was 64 mL  Urinalysis few bacteria  Assessment & Plan:  Ms. Cordial is a 61 or a woman with mild stress incontinence. She has a history of nephrolithiasis. She otherwise has minimal voiding dysfunction with mild nocturia. Picture was drawn regarding cystocele. Role of physical therapy discussed. Role of baseline renal ultrasound for stone disease discussed Reassurance given. No physical therapy consultation  given. See Dr. Francee Piccolo in 2 months with a baseline renal ultrasound   1. Bladder prolapse, female, acquired 2 nocturia 3 nephrolithiasis 4. Cystocele - Urinalysis, Complete - BLADDER SCAN AMB NON-IMAGING    Martina Sinner, MD  [facility] [address], [address] [place], [place] [address] [phone]

## 2014-11-16 NOTE — Addendum Note (Signed)
Addended by: Martha Clan on: 11/16/2014 04:29 PM   Modules accepted: Orders

## 2014-12-04 ENCOUNTER — Ambulatory Visit
Admission: RE | Admit: 2014-12-04 | Discharge: 2014-12-04 | Disposition: A | Discharge status: Home / Self Care | Payer: BLUE CROSS/BLUE SHIELD | Source: Ambulatory Visit | Attending: Urology | Admitting: Urology

## 2014-12-04 DIAGNOSIS — N200 Calculus of kidney: Secondary | ICD-10-CM

## 2015-01-19 ENCOUNTER — Encounter: Payer: Self-pay | Admitting: Urology

## 2015-01-19 ENCOUNTER — Ambulatory Visit: Payer: BLUE CROSS/BLUE SHIELD | Admitting: Urology

## 2015-10-18 ENCOUNTER — Encounter: Payer: BLUE CROSS/BLUE SHIELD | Admitting: Internal Medicine

## 2015-11-11 ENCOUNTER — Telehealth: Payer: BLUE CROSS/BLUE SHIELD | Admitting: Physician Assistant

## 2015-11-11 ENCOUNTER — Telehealth: Payer: Self-pay | Admitting: Internal Medicine

## 2015-11-11 DIAGNOSIS — Z1239 Encounter for other screening for malignant neoplasm of breast: Secondary | ICD-10-CM

## 2015-11-11 DIAGNOSIS — H10029 Other mucopurulent conjunctivitis, unspecified eye: Secondary | ICD-10-CM

## 2015-11-11 MED ORDER — POLYMYXIN B-TRIMETHOPRIM 10000-0.1 UNIT/ML-% OP SOLN: OPHTHALMIC | 0 refills | Status: DC

## 2015-11-11 NOTE — Telephone Encounter (Signed)
Order placed for mammogram.  I have made a note for 3D.  She will also need to tell them over there that she wants a 3D - to make sure 3D is done.  Does she want to schedule herself.  Order is in.  If not, [facility]  Thanks

## 2015-11-11 NOTE — Progress Notes (Signed)

## 2015-11-11 NOTE — Telephone Encounter (Signed)
Pt would like to get her mammo done she was due on 06/18/15. Pt would like a referral and to get the 3d at [facility]. Thank you!  Call pt @ [phone].

## 2015-11-11 NOTE — Telephone Encounter (Signed)
Please advise, thanks.

## 2015-11-11 NOTE — Telephone Encounter (Signed)
Please assist, thanks

## 2015-11-26 ENCOUNTER — Encounter: Payer: Self-pay | Admitting: Internal Medicine

## 2015-11-26 ENCOUNTER — Other Ambulatory Visit: Payer: Self-pay | Admitting: Internal Medicine

## 2015-11-26 DIAGNOSIS — Z1239 Encounter for other screening for malignant neoplasm of breast: Secondary | ICD-10-CM

## 2015-11-26 NOTE — Progress Notes (Signed)
Order placed for 3D mammogram.  

## 2015-12-22 ENCOUNTER — Ambulatory Visit
Admission: RE | Admit: 2015-12-22 | Discharge: 2015-12-22 | Disposition: A | Discharge status: Home / Self Care | Payer: BLUE CROSS/BLUE SHIELD | Source: Ambulatory Visit | Attending: Internal Medicine | Admitting: Internal Medicine

## 2015-12-22 DIAGNOSIS — Z1239 Encounter for other screening for malignant neoplasm of breast: Secondary | ICD-10-CM

## 2015-12-22 DIAGNOSIS — R928 Other abnormal and inconclusive findings on diagnostic imaging of breast: Secondary | ICD-10-CM | POA: Diagnosis not present

## 2015-12-22 DIAGNOSIS — Z1231 Encounter for screening mammogram for malignant neoplasm of breast: Secondary | ICD-10-CM | POA: Diagnosis not present

## 2015-12-24 ENCOUNTER — Other Ambulatory Visit: Payer: Self-pay | Admitting: Internal Medicine

## 2015-12-24 DIAGNOSIS — R928 Other abnormal and inconclusive findings on diagnostic imaging of breast: Secondary | ICD-10-CM

## 2015-12-24 NOTE — Progress Notes (Signed)
Order placed for f/u mammogram (left) and ultrasound.

## 2015-12-27 ENCOUNTER — Telehealth: Payer: Self-pay | Admitting: Internal Medicine

## 2015-12-27 NOTE — Telephone Encounter (Signed)
Pt called returning your call. Thank you!  Call pt @ [phone]

## 2015-12-27 NOTE — Telephone Encounter (Signed)
See results note for details 

## 2015-12-28 NOTE — Progress Notes (Signed)
Spoke to [facility]-since this is an additional view Lelon MastSamantha will need to schedule it and she will call pt to schedule. I have sent a message to pt regarding this.

## 2016-01-04 ENCOUNTER — Telehealth: Payer: Self-pay | Admitting: Internal Medicine

## 2016-01-04 NOTE — Telephone Encounter (Signed)
Please advise 

## 2016-01-04 NOTE — Telephone Encounter (Signed)
Melissa, can you help with this?  Thanks   

## 2016-01-04 NOTE — Telephone Encounter (Signed)
Pt called and asked if Dr. Lorin PicketScott would be able to get pt in for a sooner appt for additional imagining on left breast. Please advise, thank you!  Call pt @ [phone]

## 2016-01-19 DIAGNOSIS — H524 Presbyopia: Secondary | ICD-10-CM | POA: Diagnosis not present

## 2016-01-20 ENCOUNTER — Ambulatory Visit
Admission: RE | Admit: 2016-01-20 | Discharge: 2016-01-20 | Disposition: A | Discharge status: Home / Self Care | Payer: BLUE CROSS/BLUE SHIELD | Source: Ambulatory Visit | Attending: Internal Medicine | Admitting: Internal Medicine

## 2016-01-20 DIAGNOSIS — R922 Inconclusive mammogram: Secondary | ICD-10-CM | POA: Diagnosis not present

## 2016-01-20 DIAGNOSIS — R928 Other abnormal and inconclusive findings on diagnostic imaging of breast: Secondary | ICD-10-CM | POA: Diagnosis present

## 2016-01-20 DIAGNOSIS — N6002 Solitary cyst of left breast: Secondary | ICD-10-CM | POA: Diagnosis not present

## 2016-02-18 ENCOUNTER — Encounter: Payer: BLUE CROSS/BLUE SHIELD | Admitting: Internal Medicine

## 2016-03-16 ENCOUNTER — Telehealth: Payer: BLUE CROSS/BLUE SHIELD | Admitting: Family

## 2016-03-16 DIAGNOSIS — B9689 Other specified bacterial agents as the cause of diseases classified elsewhere: Secondary | ICD-10-CM

## 2016-03-16 DIAGNOSIS — J329 Chronic sinusitis, unspecified: Secondary | ICD-10-CM

## 2016-03-16 MED ORDER — AMOXICILLIN-POT CLAVULANATE 875-125 MG PO TABS: 1.0000 | ORAL_TABLET | Freq: Two times a day (BID) | ORAL | 0 refills | Status: AC

## 2016-03-16 NOTE — Progress Notes (Signed)

## 2016-03-30 ENCOUNTER — Telehealth: Payer: BLUE CROSS/BLUE SHIELD | Admitting: Nurse Practitioner

## 2016-03-30 DIAGNOSIS — B9689 Other specified bacterial agents as the cause of diseases classified elsewhere: Secondary | ICD-10-CM

## 2016-03-30 DIAGNOSIS — J019 Acute sinusitis, unspecified: Secondary | ICD-10-CM

## 2016-03-30 MED ORDER — FLUTICASONE PROPIONATE 50 MCG/ACT NA SUSP: 2.0000 | Freq: Every day | NASAL | 0 refills | Status: DC

## 2016-03-30 MED ORDER — DOXYCYCLINE HYCLATE 100 MG PO TABS: 100.0000 mg | ORAL_TABLET | Freq: Two times a day (BID) | ORAL | 0 refills | Status: AC

## 2016-03-30 NOTE — Progress Notes (Signed)
We are sorry that you are not feeling well.  Here is how we plan to help!  Based on what you have shared with me it looks like you have sinusitis.  Sinusitis is inflammation and infection in the sinus cavities of the head.  Based on your presentation I believe you most likely have Acute Bacterial Sinusitis.This is an infection most likely caused by bacteria. There is not specific treatment for viral sinusitis other than to help you with the symptoms until the infection runs its course.  You may use an oral decongestant such as Mucinex D or if you have glaucoma or high blood pressure use plain Mucinex. Saline nasal spray help and can safely be used as often as needed for congestion, I have prescribed: Fluticasone nasal spray two sprays in each nostril twice a day, continue the use of this medication.  I am also prescribing Doxycycline 100mg  twice daily for an additional 10 days.    Some authorities believe that zinc sprays or the use of Echinacea may shorten the course of your symptoms.  Sinus infections are not as easily transmitted as other respiratory infection, however we still recommend that you avoid close contact with loved ones, especially the very young and elderly.  Remember to wash your hands thoroughly throughout the day as this is the number one way to prevent the spread of infection!  Home Care:  Only take medications as instructed by your medical team.  Complete the entire course of an antibiotic.  Do not take these medications with alcohol.  A steam or ultrasonic humidifier can help congestion.  You can place a towel over your head and breathe in the steam from hot water coming from a faucet.  Avoid close contacts especially the very young and the elderly.  Cover your mouth when you cough or sneeze.  Always remember to wash your hands.  Get Help Right Away If:  You develop worsening fever or sinus pain.  You develop a severe head ache or visual changes.  Your symptoms  persist after you have completed your treatment plan.  Make sure you  Understand these instructions.  Will watch your condition.  Will get help right away if you are not doing well or get worse.  Your e-visit answers were reviewed by a board certified advanced clinical practitioner to complete your personal care plan.  Depending on the condition, your plan could have included both over the counter or prescription medications.  If there is a problem please reply  once you have received a response from your provider.  Your safety is important to us.  If you have drug allergies check your prescription carefully.    You can use MyChart to ask questions about today's visit, request a non-urgent call back, or ask for a work or school excuse for 24 hours related to this e-Visit. If it has been greater than 24 hours you will need to follow up with your provider, or enter a new e-Visit to address those concerns.  You will get an e-mail in the next two days asking about your experience.  I hope that your e-visit has been valuable and will speed your recovery. Thank you for using e-visits.

## 2016-05-29 ENCOUNTER — Ambulatory Visit (INDEPENDENT_AMBULATORY_CARE_PROVIDER_SITE_OTHER): Payer: BLUE CROSS/BLUE SHIELD | Admitting: Internal Medicine

## 2016-05-29 ENCOUNTER — Encounter: Payer: Self-pay | Admitting: Internal Medicine

## 2016-05-29 ENCOUNTER — Other Ambulatory Visit (HOSPITAL_COMMUNITY)
Admission: RE | Admit: 2016-05-29 | Discharge: 2016-05-29 | Disposition: A | Discharge status: Home / Self Care | Payer: BLUE CROSS/BLUE SHIELD | Source: Ambulatory Visit | Attending: Internal Medicine | Admitting: Internal Medicine

## 2016-05-29 VITALS — BP 120/82 | HR 61 | Temp 98.8°F | Resp 12 | Ht 65.75 in | Wt 147.4 lb

## 2016-05-29 DIAGNOSIS — Z0000 Encounter for general adult medical examination without abnormal findings: Secondary | ICD-10-CM

## 2016-05-29 DIAGNOSIS — E611 Iron deficiency: Secondary | ICD-10-CM

## 2016-05-29 DIAGNOSIS — Z124 Encounter for screening for malignant neoplasm of cervix: Secondary | ICD-10-CM

## 2016-05-29 DIAGNOSIS — R928 Other abnormal and inconclusive findings on diagnostic imaging of breast: Secondary | ICD-10-CM

## 2016-05-29 DIAGNOSIS — R87619 Unspecified abnormal cytological findings in specimens from cervix uteri: Secondary | ICD-10-CM

## 2016-05-29 DIAGNOSIS — N926 Irregular menstruation, unspecified: Secondary | ICD-10-CM

## 2016-05-29 LAB — CBC WITH DIFFERENTIAL/PLATELET
BASOS ABS: 0 10*3/uL (ref 0.0–0.1)
Basophils Relative: 0.6 % (ref 0.0–3.0)
Eosinophils Absolute: 0.1 10*3/uL (ref 0.0–0.7)
Eosinophils Relative: 2 % (ref 0.0–5.0)
HEMATOCRIT: 40.8 % (ref 36.0–46.0)
Hemoglobin: 13.9 g/dL (ref 12.0–15.0)
LYMPHS PCT: 30 % (ref 12.0–46.0)
Lymphs Abs: 1.9 10*3/uL (ref 0.7–4.0)
MCHC: 34.1 g/dL (ref 30.0–36.0)
MCV: 85.1 fl (ref 78.0–100.0)
MONOS PCT: 7.7 % (ref 3.0–12.0)
Monocytes Absolute: 0.5 10*3/uL (ref 0.1–1.0)
Neutro Abs: 3.7 10*3/uL (ref 1.4–7.7)
Neutrophils Relative %: 59.7 % (ref 43.0–77.0)
Platelets: 224 10*3/uL (ref 150.0–400.0)
RBC: 4.79 Mil/uL (ref 3.87–5.11)
RDW: 13.1 % (ref 11.5–15.5)
WBC: 6.3 10*3/uL (ref 4.0–10.5)

## 2016-05-29 LAB — TSH: TSH: 1.2 u[IU]/mL (ref 0.35–4.50)

## 2016-05-29 LAB — FERRITIN: FERRITIN: 24.4 ng/mL (ref 10.0–291.0)

## 2016-05-29 NOTE — Progress Notes (Signed)
Patient ID: Amanda Rojas, female   DOB: 1970-05-01, 46 y.o.   MRN: [id number]   Subjective:    Patient ID: Amanda Rojas, female    DOB: 1970/12/07, 46 y.o.   MRN: [id number]  HPI  Patient here for her physical exam.  She reports she is doing relatively well.  Stays active.  Breathing stable.  No acid reflux.  No abdominal pain.  Bowels moving.  miralax working.  Periods heavy for 1-2 days.  Not quite as regular.  Will keep menstrual diary.  Had mammogram in the fall.     Past Medical History:  Diagnosis Date  . History of HPV infection    abnormal pap s/p cryosurgery  . Hx of migraine headaches    menstrual migraines  . Kidney stones    Past Surgical History:  Procedure Laterality Date  . WRIST FRACTURE SURGERY  1996   metal plate placed   Family History  Problem Relation Age of Onset  . Arthritis Mother   . Thyroid disease Mother   . Heart disease Maternal Grandmother   . Breast cancer Cousin 37    subsequently developed acute leukemia  . Prostate cancer Neg Hx   . Bladder Cancer Neg Hx    Social History   Social History  . Marital status: Married    Spouse name: N/A  . Number of children: 3  . Years of education: N/A   Occupational History  .  [employer]   Social History Main Topics  . Smoking status: Never Smoker  . Smokeless tobacco: Never Used  . Alcohol use 0.0 oz/week  . Drug use: No  . Sexual activity: Not Asked   Other Topics Concern  . None   Social History Narrative  . None    Outpatient Encounter Prescriptions as of 05/29/2016  Medication Sig  . ferrous sulfate 325 (65 FE) MG tablet Take 325 mg by mouth daily with breakfast.  . trimethoprim-polymyxin b (POLYTRIM) ophthalmic solution Place 1-2 drops in the affected eye, 4 times per day for 5 days.  . fluticasone (FLONASE) 50 MCG/ACT nasal spray Place 2 sprays into both nostrils daily.  . Probiotic Product (PROBIOTIC PO) Take by mouth.   No facility-administered encounter  medications on file as of 05/29/2016.     Review of Systems  Constitutional: Negative for appetite change and unexpected weight change.  HENT: Negative for congestion and sinus pressure.   Eyes: Negative for pain and visual disturbance.  Respiratory: Negative for cough, chest tightness and shortness of breath.   Cardiovascular: Negative for chest pain, palpitations and leg swelling.  Gastrointestinal: Negative for abdominal pain, diarrhea, nausea and vomiting.  Genitourinary: Negative for difficulty urinating and dysuria.  Musculoskeletal: Negative for back pain and joint swelling.  Skin: Negative for color change and rash.  Neurological: Negative for dizziness, light-headedness and headaches.  Hematological: Negative for adenopathy. Does not bruise/bleed easily.  Psychiatric/Behavioral: Negative for agitation and dysphoric mood.       Objective:    Physical Exam  Constitutional: She is oriented to person, place, and time. She appears well-developed and well-nourished. No distress.  HENT:  Nose: Nose normal.  Mouth/Throat: Oropharynx is clear and moist.  Eyes: Right eye exhibits no discharge. Left eye exhibits no discharge. No scleral icterus.  Neck: Neck supple. No thyromegaly present.  Cardiovascular: Normal rate and regular rhythm.   Pulmonary/Chest: Breath sounds normal. No accessory muscle usage. No tachypnea. No respiratory distress. She has no decreased breath sounds. She has  no wheezes. She has no rhonchi. Right breast exhibits no inverted nipple, no mass, no nipple discharge and no tenderness (no axillary adenopathy). Left breast exhibits no inverted nipple, no mass, no nipple discharge and no tenderness (no axilarry adenopathy).  Abdominal: Soft. Bowel sounds are normal. There is no tenderness.  Genitourinary:  Genitourinary Comments: Normal external genitalia.  Vaginal vault without lesions.  Cervix identified.  Pap smear performed.  Could not appreciate any adnexal masses or  tenderness.    Musculoskeletal: She exhibits no edema or tenderness.  Lymphadenopathy:    She has no cervical adenopathy.  Neurological: She is alert and oriented to person, place, and time.  Skin: Skin is warm. No rash noted. No erythema.  Psychiatric: She has a normal mood and affect. Her behavior is normal.    BP 120/82 (BP Location: Left Arm, Patient Position: Sitting, Cuff Size: Normal)   Pulse 61   Temp 98.8 F (37.1 C) (Oral)   Resp 12   Ht 5' 5.75" (1.67 m)   Wt 147 lb 6.4 oz (66.9 kg)   LMP 05/26/2016   SpO2 100%   BMI 23.97 kg/m  Wt Readings from Last 3 Encounters:  05/29/16 147 lb 6.4 oz (66.9 kg)  11/16/14 138 lb 4.8 oz (62.7 kg)  10/21/14 139 lb 6.4 oz (63.2 kg)     Lab Results  Component Value Date   WBC 6.3 05/29/2016   HGB 13.9 05/29/2016   HCT 40.8 05/29/2016   PLT 224.0 05/29/2016   GLUCOSE 92 10/16/2014   CHOL 125 10/16/2014   TRIG 41.0 10/16/2014   HDL 47.30 10/16/2014   LDLCALC 70 10/16/2014   ALT 9 10/16/2014   AST 14 10/16/2014   NA 139 10/16/2014   K 4.4 10/16/2014   CL 105 10/16/2014   CREATININE 0.63 10/16/2014   BUN 11 10/16/2014   CO2 29 10/16/2014   TSH 1.20 05/29/2016    US Breast Ltd Uni Left Inc Axilla  Result Date: 01/20/2016 CLINICAL DATA:  Left breast upper outer quadrant focal asymmetry seen on most recent screening mammography. EXAM: 2D DIGITAL DIAGNOSTIC LEFT MAMMOGRAM WITH CAD AND ADJUNCT TOMO ULTRASOUND LEFT BREAST COMPARISON:  Previous exam(s). ACR Breast Density Category c: The breast tissue is heterogeneously dense, which may obscure small masses. FINDINGS: Mammographically, there is are persistent circumscribed masses in the left breast upper outer quadrant, middle depth. Scattered calcifications throughout the left breast are stable. Mammographic images were processed with CAD. On physical exam, no suspicious masses are found. Targeted ultrasound is performed, showing no suspicious masses or shadowing lesions. Numerous  benign cysts are seen in the left breast upper outer quadrant, the largest in the left 2 o'clock breast 3 cm from the nipple measures 2.2 x 2.6 x 1.1 cm. IMPRESSION: No sonographic or mammographic evidence of malignancy in the left breast. Left breast benign-appearing cysts. RECOMMENDATION: Diagnostic mammogram is suggested in 1 year, because of patient's dense breast parenchyma, consistent with fibrocystic changes, and numerous cysts. (Code:DM-B-01Y) I have discussed the findings and recommendations with the patient. Results were also provided in writing at the conclusion of the visit. If applicable, a reminder letter will be sent to the patient regarding the next appointment. BI-RADS CATEGORY  2: Benign. Electronically Signed   By: Ted Mcalpine M.D.   On: 01/20/2016 11:07   Mm Diag Breast Tomo Uni Left  Result Date: 01/20/2016 CLINICAL DATA:  Left breast upper outer quadrant focal asymmetry seen on most recent screening mammography. EXAM: 2D DIGITAL DIAGNOSTIC  LEFT MAMMOGRAM WITH CAD AND ADJUNCT TOMO ULTRASOUND LEFT BREAST COMPARISON:  Previous exam(s). ACR Breast Density Category c: The breast tissue is heterogeneously dense, which may obscure small masses. FINDINGS: Mammographically, there is are persistent circumscribed masses in the left breast upper outer quadrant, middle depth. Scattered calcifications throughout the left breast are stable. Mammographic images were processed with CAD. On physical exam, no suspicious masses are found. Targeted ultrasound is performed, showing no suspicious masses or shadowing lesions. Numerous benign cysts are seen in the left breast upper outer quadrant, the largest in the left 2 o'clock breast 3 cm from the nipple measures 2.2 x 2.6 x 1.1 cm. IMPRESSION: No sonographic or mammographic evidence of malignancy in the left breast. Left breast benign-appearing cysts. RECOMMENDATION: Diagnostic mammogram is suggested in 1 year, because of patient's dense breast  parenchyma, consistent with fibrocystic changes, and numerous cysts. (Code:DM-B-01Y) I have discussed the findings and recommendations with the patient. Results were also provided in writing at the conclusion of the visit. If applicable, a reminder letter will be sent to the patient regarding the next appointment. BI-RADS CATEGORY  2: Benign. Electronically Signed   By: Ted Mcalpine M.D.   On: 01/20/2016 11:07       Assessment & Plan:   Problem List Items Addressed This Visit    Abnormal mammogram    Mammogram 12/23/15 - Birads 0.  f/u left brast mammogram and ultrasound 01/20/16 - Birads II.  Recommended f/u bilateral diagnostic mammogram in one year.        Abnormal Pap smear of cervix    Previous abnormal pap smear with positive HPV.  PAP 07/04/13 negative with negative HPV.  Repeat pap today.        Health care maintenance    Physical today 05/29/16.  PAP 05/29/16.  Mammogram 12/23/15 - recommended f/u views.  f/u left breast mammogram and ultrasound - Birads II.  Recommended f/u diagnostic bilateral mammogram in one year.        Iron deficiency    Follow cbc and ferritin.         Other Visit Diagnoses    Screening for cervical cancer    -  Primary   Relevant Orders   Cytology - PAP (Completed)   Menstrual changes       Relevant Orders   Ferritin (Completed)   CBC with Differential/Platelet (Completed)   TSH (Completed)       Dale [place], MD

## 2016-05-29 NOTE — Progress Notes (Signed)
Pre-visit discussion using our clinic review tool. No additional management support is needed unless otherwise documented below in the visit note.  

## 2016-05-29 NOTE — Assessment & Plan Note (Signed)
Physical today 05/29/16.  PAP 05/29/16.  Mammogram 12/23/15 - recommended f/u views.  f/u left breast mammogram and ultrasound - Birads II.  Recommended f/u diagnostic bilateral mammogram in one year.

## 2016-05-30 ENCOUNTER — Encounter: Payer: Self-pay | Admitting: Internal Medicine

## 2016-05-31 LAB — CYTOLOGY - PAP
Diagnosis: NEGATIVE
HPV: NOT DETECTED

## 2016-06-01 ENCOUNTER — Encounter: Payer: Self-pay | Admitting: Internal Medicine

## 2016-06-06 NOTE — Telephone Encounter (Signed)
Hard copy mailed to patient.

## 2016-06-11 ENCOUNTER — Encounter: Payer: Self-pay | Admitting: Internal Medicine

## 2016-06-11 NOTE — Assessment & Plan Note (Signed)
Mammogram 12/23/15 - Birads 0.  f/u left brast mammogram and ultrasound 01/20/16 - Birads II.  Recommended f/u bilateral diagnostic mammogram in one year.

## 2016-06-11 NOTE — Assessment & Plan Note (Signed)
Follow cbc and ferritin.  

## 2016-06-11 NOTE — Assessment & Plan Note (Signed)
Previous abnormal pap smear with positive HPV.  PAP 07/04/13 negative with negative HPV.  Repeat pap today.

## 2016-08-30 ENCOUNTER — Telehealth: Payer: BLUE CROSS/BLUE SHIELD | Admitting: Nurse Practitioner

## 2016-08-30 DIAGNOSIS — J0100 Acute maxillary sinusitis, unspecified: Secondary | ICD-10-CM

## 2016-08-30 MED ORDER — AMOXICILLIN-POT CLAVULANATE 875-125 MG PO TABS: 1.0000 | ORAL_TABLET | Freq: Two times a day (BID) | ORAL | 0 refills | Status: DC

## 2016-08-30 NOTE — Progress Notes (Signed)

## 2016-11-02 IMAGING — MG MM DIGITAL SCREENING BILAT W/ TOMO W/ CAD
8 of 12 series · 8 of 28 positions shown · non-contrast
Comparison: Previous exam(s).

CLINICAL DATA: Screening.

EXAM:
2D DIGITAL SCREENING BILATERAL MAMMOGRAM WITH CAD AND ADJUNCT TOMO

[R MLO synth-2D]
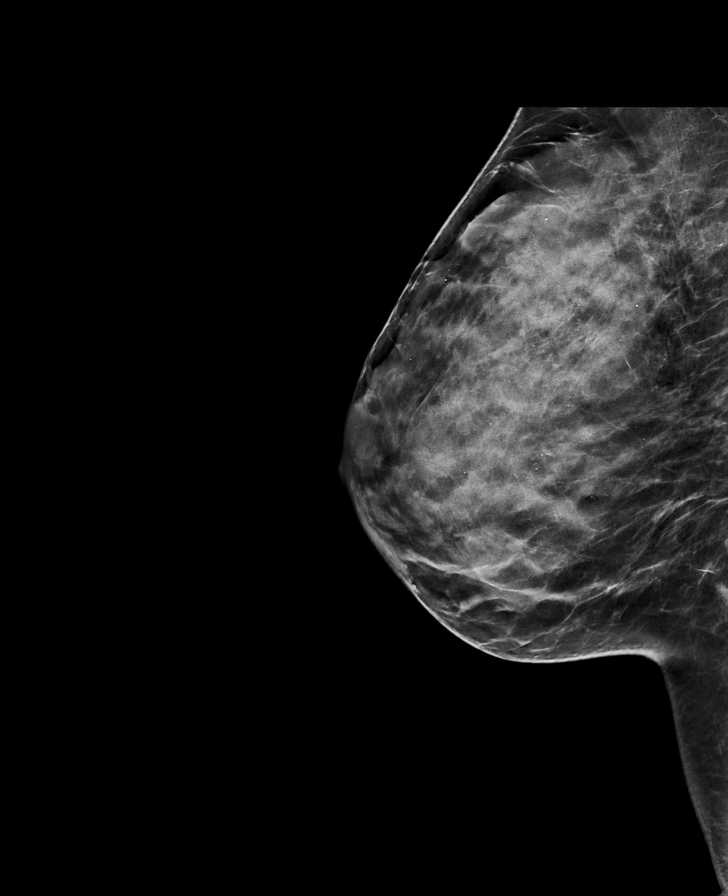

[R CC]
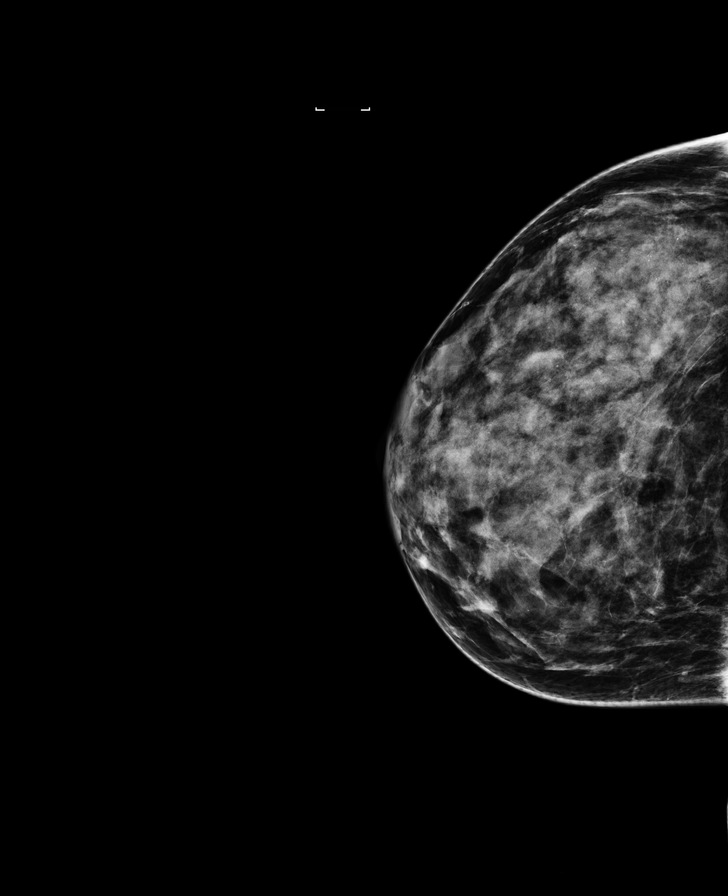

[L CC]
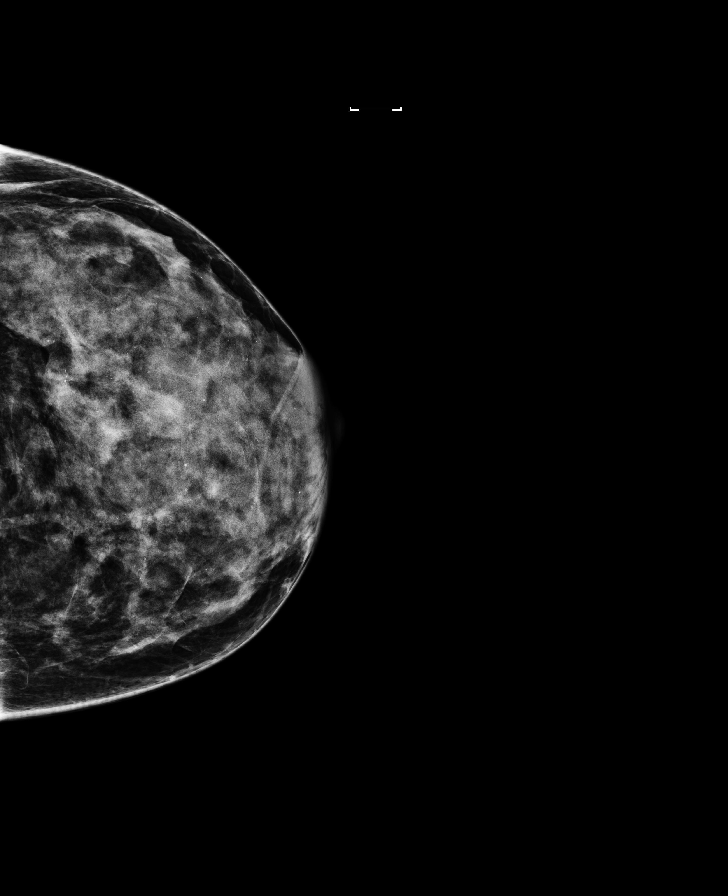

[R CC synth-2D]
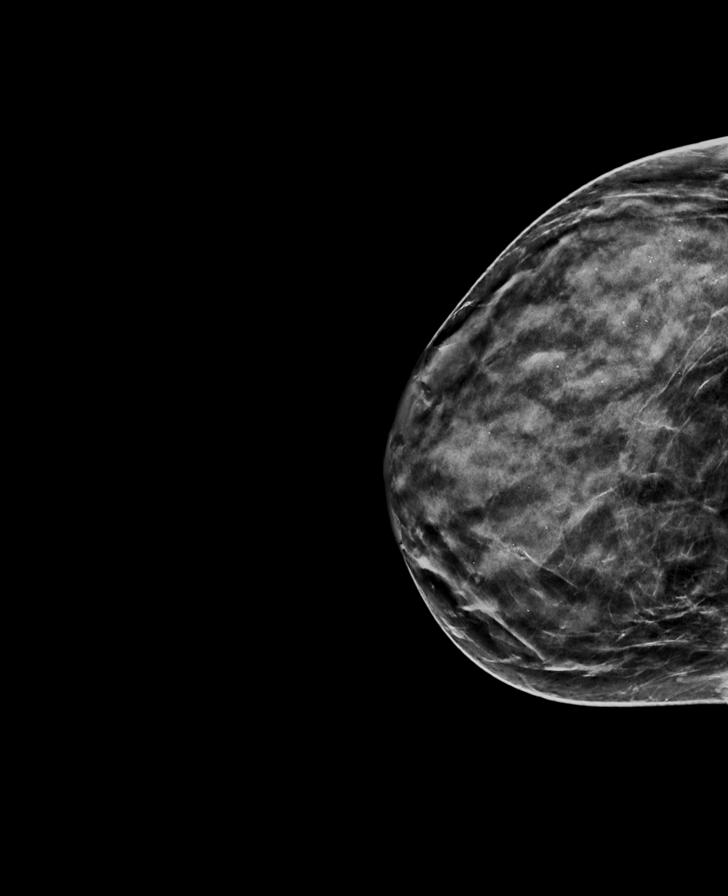

[R MLO]
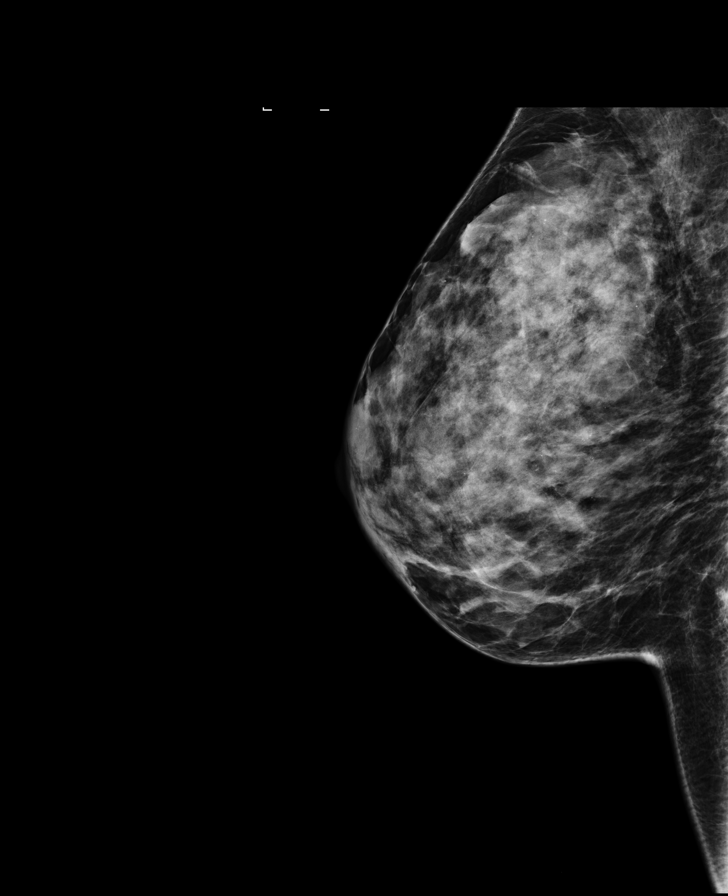

[L MLO]
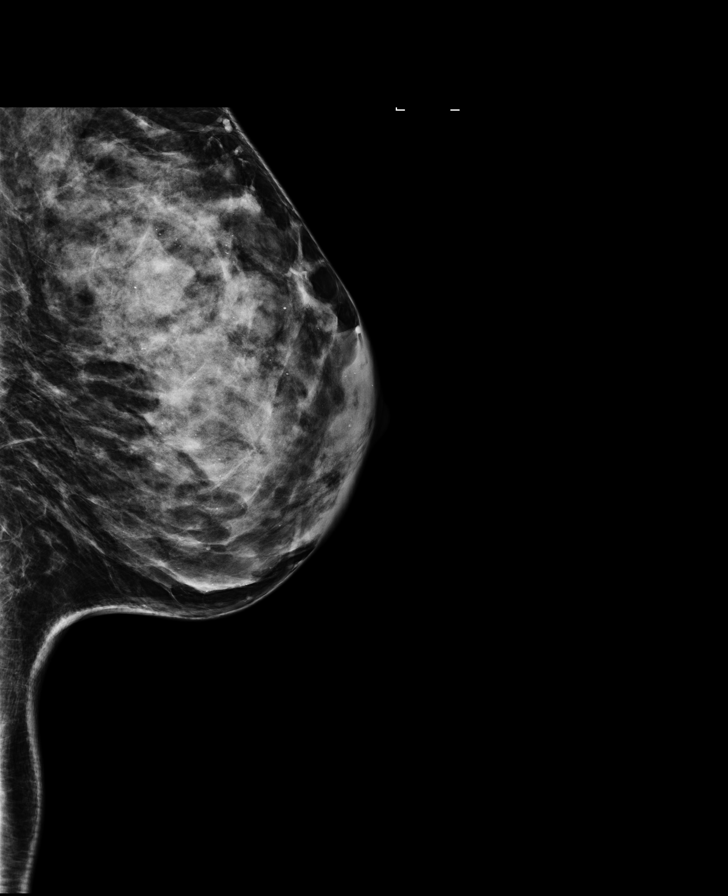

[L MLO synth-2D]
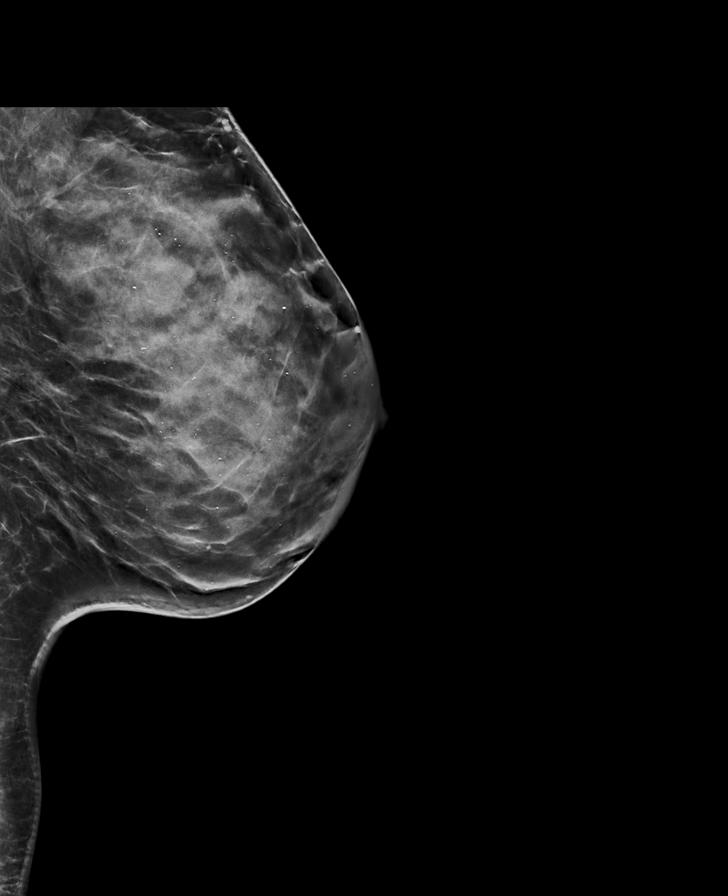

[L CC synth-2D]
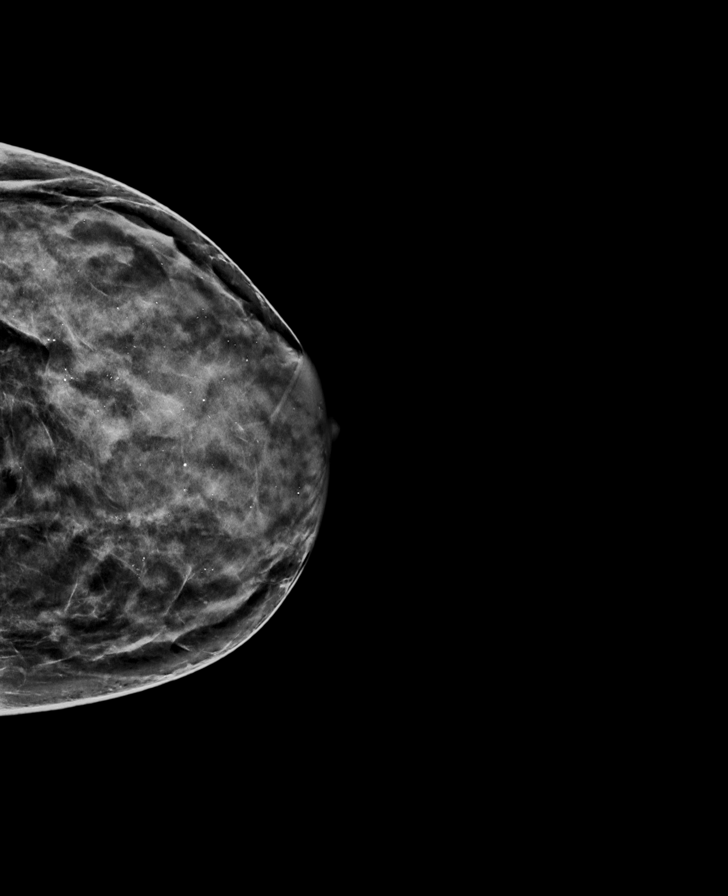

[8 of 28 positions shown; findings below may reference images not displayed]

ACR Breast Density Category d: The breast tissue is extremely dense,
which lowers the sensitivity of mammography.
FINDINGS: In the left breast, a possible mass warrants further evaluation. In
the right breast, no findings suspicious for malignancy.

Images were processed with CAD.
IMPRESSION: Further evaluation is suggested for possible mass in the left
breast.

RECOMMENDATION:
Diagnostic mammogram and possibly ultrasound of the left breast.
(Code:[id number]-[id number])

The patient will be contacted regarding the findings, and additional
imaging will be scheduled.

BI-RADS CATEGORY  0: Incomplete. Need additional imaging evaluation
and/or prior mammograms for comparison.

## 2016-12-11 IMAGING — US US RENAL
1 series · 14 of 23 positions shown · non-contrast
Comparison: none

[Series 1: us renal · 0.23mm/px · 14 of 23 slices shown]
[im 1/23]
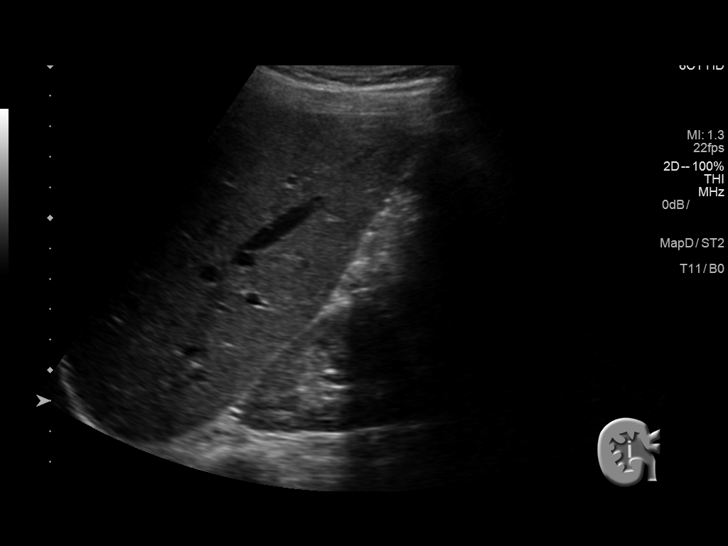
[im 3/23]
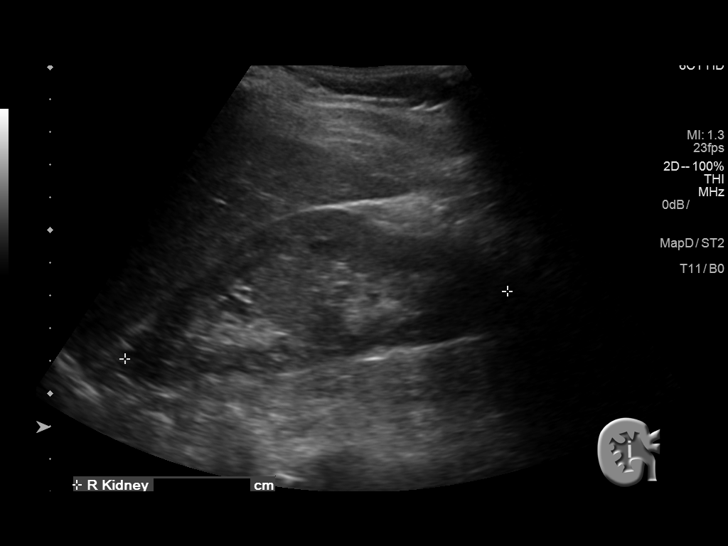
[im 5/23]
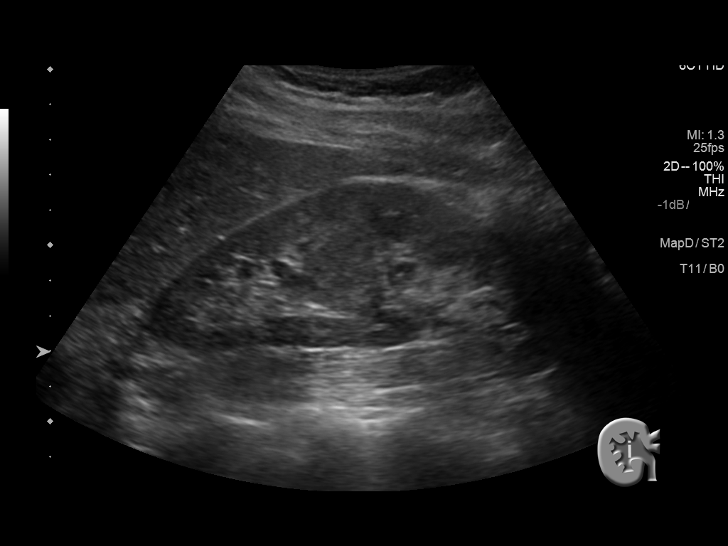
[im 6/23]
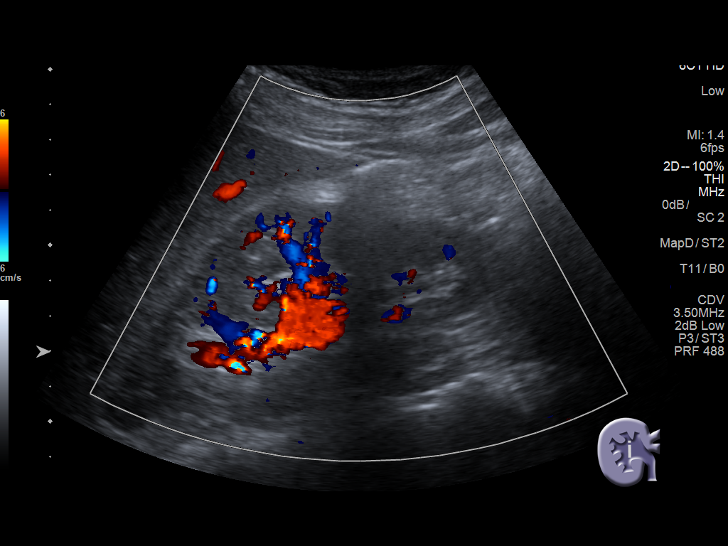
[im 8/23]
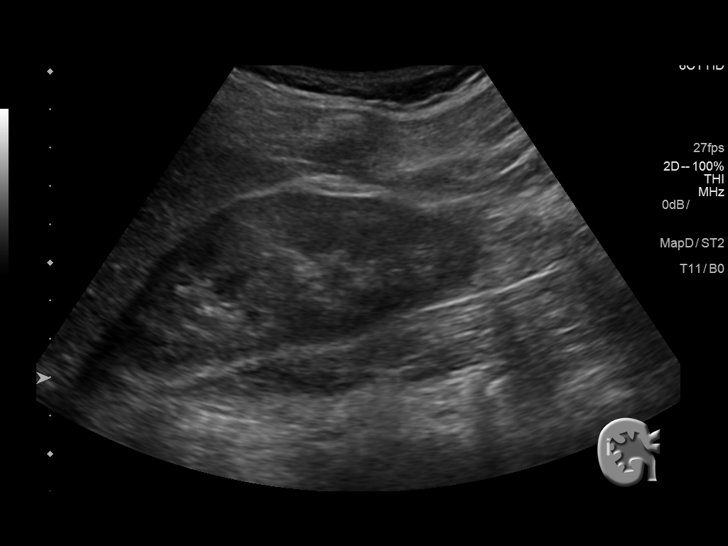
[im 10/23]
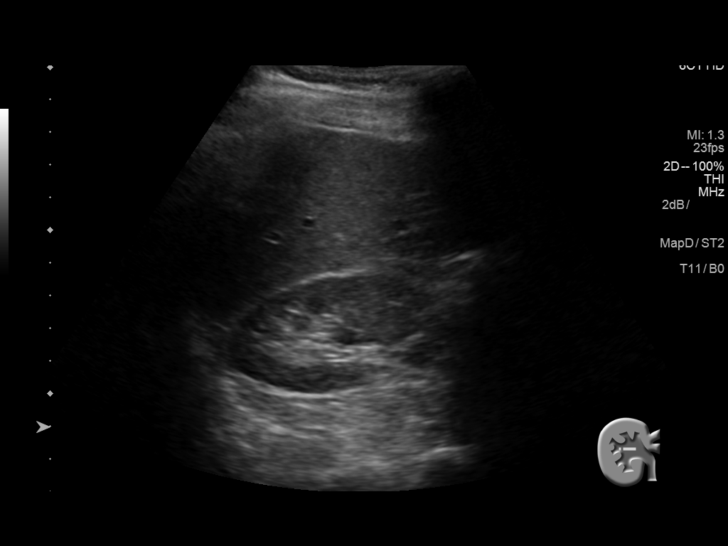
[im 11/23]
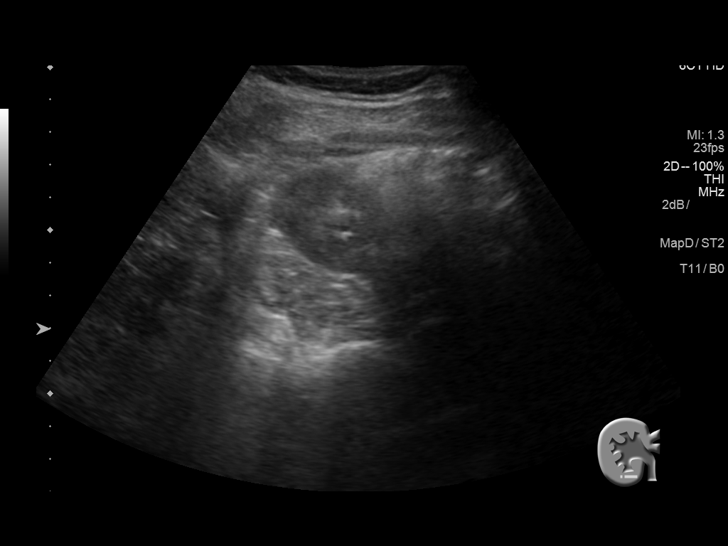
[im 13/23]
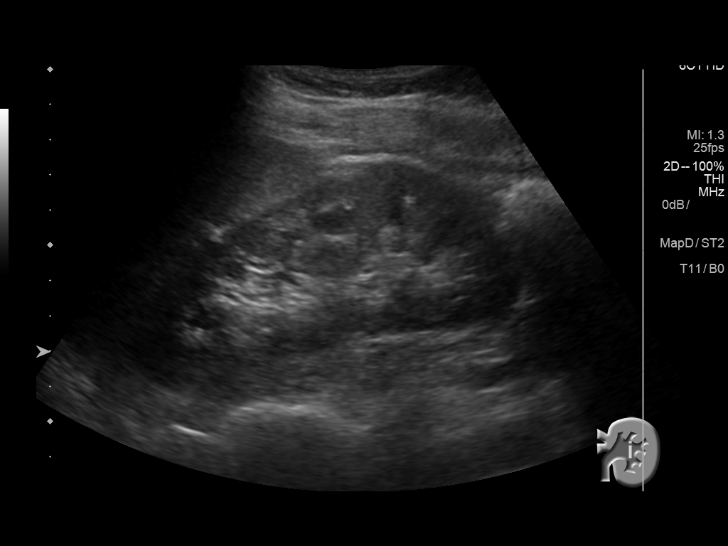
[im 14/23]
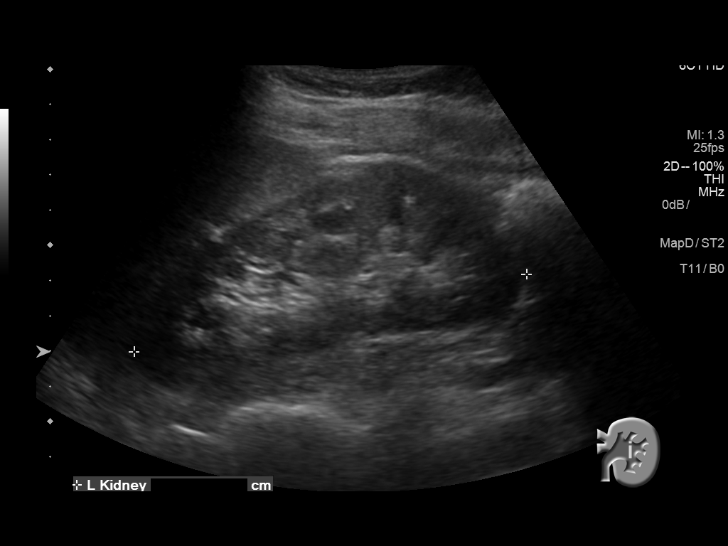
[im 16/23]
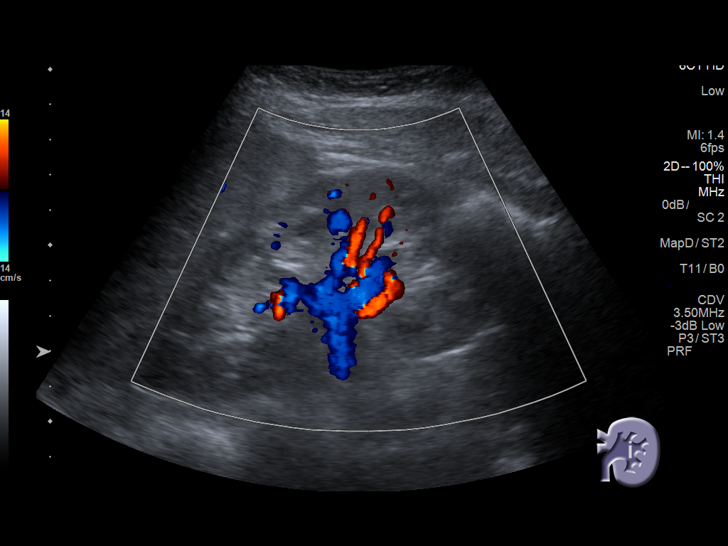
[im 18/23]
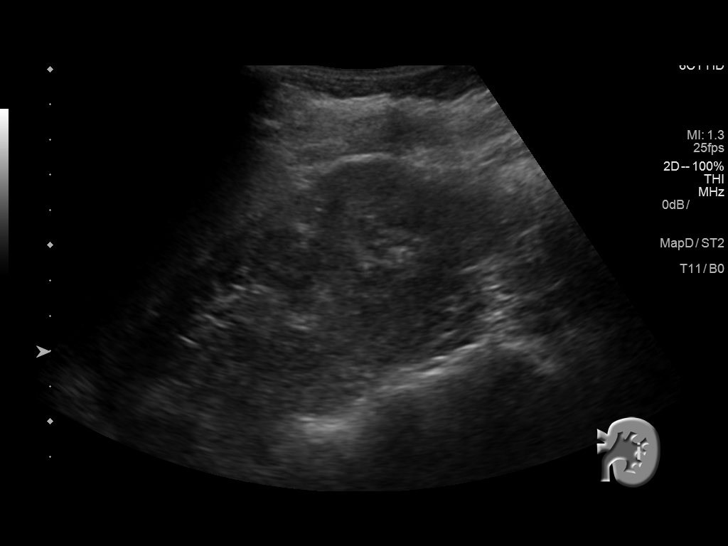
[im 19/23]
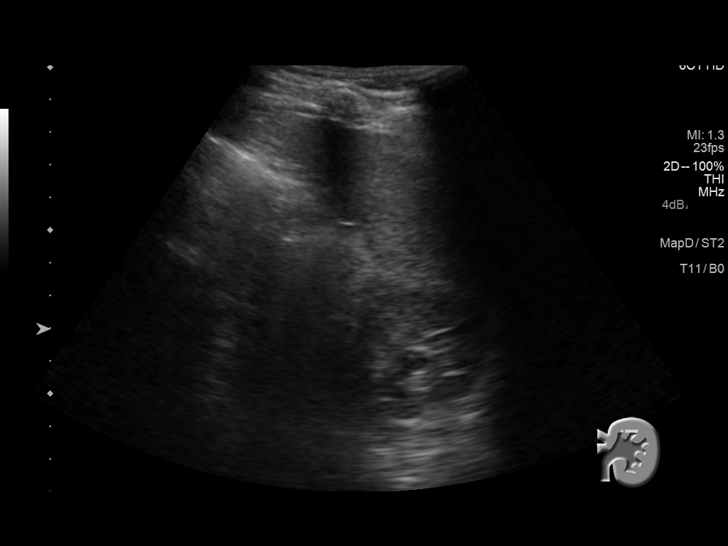
[im 21/23]
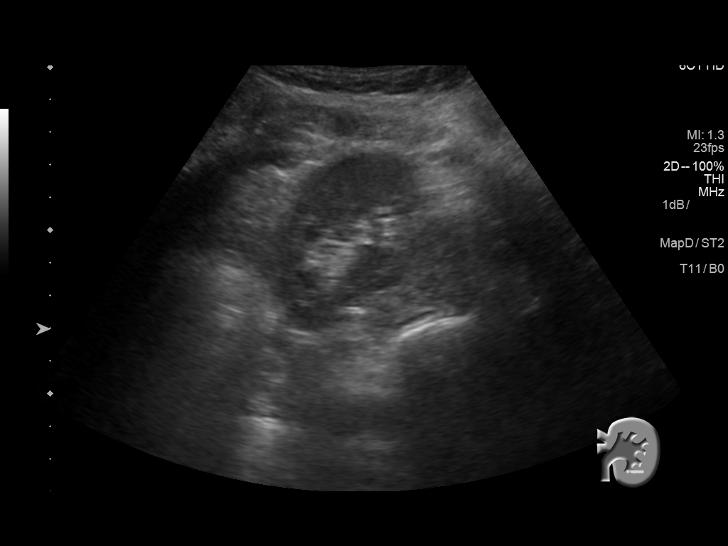
[im 23/23]
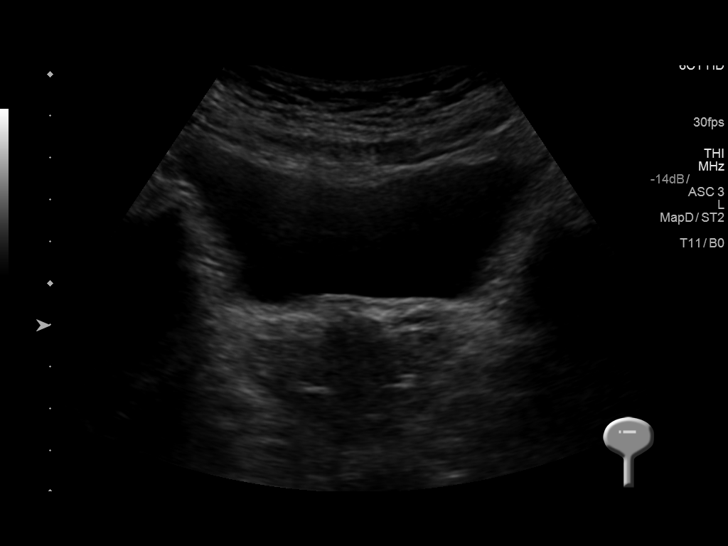

[14 of 23 positions shown; findings below may reference images not displayed]

Canned report from images found in remote index.

Refer to host system for actual result text.

## 2017-07-23 ENCOUNTER — Telehealth: Payer: BLUE CROSS/BLUE SHIELD | Admitting: Family

## 2017-07-23 DIAGNOSIS — N390 Urinary tract infection, site not specified: Secondary | ICD-10-CM

## 2017-07-23 MED ORDER — CEPHALEXIN 500 MG PO CAPS: 500.0000 mg | ORAL_CAPSULE | Freq: Two times a day (BID) | ORAL | 0 refills | Status: DC

## 2017-07-23 NOTE — Progress Notes (Signed)

## 2017-09-26 DIAGNOSIS — H524 Presbyopia: Secondary | ICD-10-CM | POA: Diagnosis not present

## 2017-10-03 ENCOUNTER — Encounter: Payer: Self-pay | Admitting: Internal Medicine

## 2017-11-09 ENCOUNTER — Ambulatory Visit: Payer: BLUE CROSS/BLUE SHIELD | Admitting: Internal Medicine

## 2017-11-09 DIAGNOSIS — Z0289 Encounter for other administrative examinations: Secondary | ICD-10-CM

## 2017-12-26 DIAGNOSIS — R51 Headache: Secondary | ICD-10-CM | POA: Diagnosis not present

## 2018-01-14 ENCOUNTER — Ambulatory Visit (INDEPENDENT_AMBULATORY_CARE_PROVIDER_SITE_OTHER): Payer: BLUE CROSS/BLUE SHIELD | Admitting: Internal Medicine

## 2018-01-14 ENCOUNTER — Encounter: Payer: Self-pay | Admitting: Internal Medicine

## 2018-01-14 VITALS — BP 130/64 | HR 73 | Temp 98.1°F | Ht 65.75 in | Wt 152.6 lb

## 2018-01-14 DIAGNOSIS — R928 Other abnormal and inconclusive findings on diagnostic imaging of breast: Secondary | ICD-10-CM

## 2018-01-14 DIAGNOSIS — R51 Headache: Secondary | ICD-10-CM

## 2018-01-14 DIAGNOSIS — E611 Iron deficiency: Secondary | ICD-10-CM | POA: Diagnosis not present

## 2018-01-14 DIAGNOSIS — Z13220 Encounter for screening for lipoid disorders: Secondary | ICD-10-CM

## 2018-01-14 DIAGNOSIS — R519 Headache, unspecified: Secondary | ICD-10-CM

## 2018-01-14 DIAGNOSIS — Z0000 Encounter for general adult medical examination without abnormal findings: Secondary | ICD-10-CM

## 2018-01-14 DIAGNOSIS — N6019 Diffuse cystic mastopathy of unspecified breast: Secondary | ICD-10-CM

## 2018-01-14 MED ORDER — MAGNESIUM OXIDE 400 MG PO TABS: ORAL_TABLET | ORAL | 2 refills | Status: DC

## 2018-01-14 NOTE — Patient Instructions (Signed)
Saline nasal spray - flush nose at least 2-3x/day  flonase nasal spray - 2 sprays each nostril one time per day.  Do this in the evening.   

## 2018-01-14 NOTE — Progress Notes (Signed)
Pre visit review using our clinic review tool, if applicable. No additional management support is needed unless otherwise documented below in the visit note. 

## 2018-01-14 NOTE — Progress Notes (Signed)
Patient ID: Amanda Rojas, female   DOB: 09/17/1970, 47 y.o.   MRN: [id number]   Subjective:    Patient ID: Amanda Rojas, female    DOB: [birth year], 47 y.o.   MRN: [id number]  HPI  Patient here for her physical exam.  Recently saw ophthalmology.  Initially told had increased pressures.  Recheck pressures - wnl.  Has been having some headaches.  Wakes up with headache.  Some watery eyes.  Taking claritin and using flonase.  Some occasional sinus pressure.  No neck pain or stiffness.  History of migraines.  Describes headaches as bilateral, but usually worse on the left side.  Starts at the base of her neck.  Takes otc pain meds.  May occur 2x/week.  Has a history of kidney stones.  No chest pain.  No chest congestion or cough.  Breathing stable.  No acid reflux.  No abdominal pain.  Bowels moving.  Takes miralax.  Periods q 4-6 weeks.  Some heavy clotting.     Past Medical History:  Diagnosis Date  . History of HPV infection    abnormal pap s/p cryosurgery  . Hx of migraine headaches    menstrual migraines  . Kidney stones    Past Surgical History:  Procedure Laterality Date  . WRIST FRACTURE SURGERY  1996   metal plate placed   Family History  Problem Relation Age of Onset  . Arthritis Mother   . Thyroid disease Mother   . Heart disease Maternal Grandmother   . Breast cancer Cousin 37       subsequently developed acute leukemia  . Prostate cancer Neg Hx   . Bladder Cancer Neg Hx    Social History   Socioeconomic History  . Marital status: Married    Spouse name: Not on file  . Number of children: 3  . Years of education: Not on file  . Highest education level: Not on file  Occupational History    Employer: [employer]  Social Needs  . Financial resource strain: Not on file  . Food insecurity:    Worry: Not on file    Inability: Not on file  . Transportation needs:    Medical: Not on file    Non-medical: Not on file  Tobacco Use  . Smoking status: Never  Smoker  . Smokeless tobacco: Never Used  Substance and Sexual Activity  . Alcohol use: Yes    Alcohol/week: 0.0 standard drinks  . Drug use: No  . Sexual activity: Not on file  Lifestyle  . Physical activity:    Days per week: Not on file    Minutes per session: Not on file  . Stress: Not on file  Relationships  . Social connections:    Talks on phone: Not on file    Gets together: Not on file    Attends religious service: Not on file    Active member of club or organization: Not on file    Attends meetings of clubs or organizations: Not on file    Relationship status: Not on file  Other Topics Concern  . Not on file  Social History Narrative  . Not on file    Outpatient Encounter Medications as of 01/14/2018  Medication Sig  . ferrous sulfate 325 (65 FE) MG tablet Take 325 mg by mouth daily with breakfast.  . loratadine (CLARITIN) 10 MG tablet Take 10 mg by mouth daily.  . fluticasone (FLONASE) 50 MCG/ACT nasal spray Place 2 sprays into both  nostrils daily.  . magnesium oxide (MAG-OX) 400 MG tablet One tablet per day  . [DISCONTINUED] amoxicillin-clavulanate (AUGMENTIN) 875-125 MG tablet Take 1 tablet by mouth 2 (two) times daily.  . [DISCONTINUED] cephALEXin (KEFLEX) 500 MG capsule Take 1 capsule (500 mg total) by mouth 2 (two) times daily.  . [DISCONTINUED] Probiotic Product (PROBIOTIC PO) Take by mouth.  . [DISCONTINUED] trimethoprim-polymyxin b (POLYTRIM) ophthalmic solution Place 1-2 drops in the affected eye, 4 times per day for 5 days.   No facility-administered encounter medications on file as of 01/14/2018.     Review of Systems  Constitutional: Negative for appetite change and unexpected weight change.  HENT: Positive for congestion. Negative for sinus pressure.   Eyes: Negative for pain and visual disturbance.  Respiratory: Negative for cough, chest tightness and shortness of breath.   Cardiovascular: Negative for chest pain, palpitations and leg swelling.    Gastrointestinal: Negative for abdominal pain, diarrhea, nausea and vomiting.  Genitourinary: Negative for difficulty urinating and dysuria.  Musculoskeletal: Negative for joint swelling and myalgias.  Skin: Negative for color change and rash.  Neurological: Positive for headaches. Negative for dizziness and light-headedness.  Hematological: Negative for adenopathy. Does not bruise/bleed easily.  Psychiatric/Behavioral: Negative for agitation and dysphoric mood.       Objective:    Physical Exam  Constitutional: She is oriented to person, place, and time. She appears well-developed and well-nourished. No distress.  HENT:  Nose: Nose normal.  Mouth/Throat: Oropharynx is clear and moist.  Eyes: Right eye exhibits no discharge. Left eye exhibits no discharge. No scleral icterus.  Neck: Neck supple. No thyromegaly present.  Cardiovascular: Normal rate and regular rhythm.  Pulmonary/Chest: Breath sounds normal. No accessory muscle usage. No tachypnea. No respiratory distress. She has no decreased breath sounds. She has no wheezes. She has no rhonchi. Right breast exhibits no inverted nipple, no mass, no nipple discharge and no tenderness (no axillary adenopathy). Left breast exhibits no inverted nipple, no mass, no nipple discharge and no tenderness (no axilarry adenopathy).  Abdominal: Soft. Bowel sounds are normal. There is no tenderness.  Musculoskeletal: She exhibits no edema or tenderness.  Lymphadenopathy:    She has no cervical adenopathy.  Neurological: She is alert and oriented to person, place, and time.  Skin: No rash noted. No erythema.  Psychiatric: She has a normal mood and affect. Her behavior is normal.    BP 130/64   Pulse 73   Temp 98.1 F (36.7 C) (Oral)   Ht 5' 5.75" (1.67 m)   Wt 152 lb 9.6 oz (69.2 kg)   SpO2 97%   BMI 24.82 kg/m  Wt Readings from Last 3 Encounters:  01/14/18 152 lb 9.6 oz (69.2 kg)  05/29/16 147 lb 6.4 oz (66.9 kg)  11/16/14 138 lb 4.8 oz  (62.7 kg)     Lab Results  Component Value Date   WBC 6.3 05/29/2016   HGB 13.9 05/29/2016   HCT 40.8 05/29/2016   PLT 224.0 05/29/2016   GLUCOSE 92 10/16/2014   CHOL 125 10/16/2014   TRIG 41.0 10/16/2014   HDL 47.30 10/16/2014   LDLCALC 70 10/16/2014   ALT 9 10/16/2014   AST 14 10/16/2014   NA 139 10/16/2014   K 4.4 10/16/2014   CL 105 10/16/2014   CREATININE 0.63 10/16/2014   BUN 11 10/16/2014   CO2 29 10/16/2014   TSH 1.20 05/29/2016       Assessment & Plan:   Problem List Items Addressed This Visit  Abnormal mammogram    Last mammogram 2017.  Recommended f/u diagnostic (bilateral) mammogram.  Schedule.        Headache    Headaches as outlined.  Treat allergies.  Has had eyes checked recently.  Start mag oxide 400mg  daily.  Follow.        Health care maintenance    Physical today 01/14/18.  PAP 05/2016 - negative with negative HPV.  Mammogram 2017.  Recommended f/u diagnostic bilateral mammogram.  Schedule.        Iron deficiency    Follow cbc and ferritin.       Relevant Orders   CBC with Differential/Platelet   Comprehensive metabolic panel   Ferritin   TSH    Other Visit Diagnoses    Routine general medical examination at a health care facility    -  Primary   Fibrocystic breast disease (FCBD), unspecified laterality       Relevant Orders   MM DIAG BREAST TOMO BILATERAL   US BREAST LTD UNI LEFT INC AXILLA   US BREAST LTD UNI RIGHT INC AXILLA   Screening cholesterol level       Relevant Orders   Lipid panel       Dale Deerfield, MD

## 2018-01-20 ENCOUNTER — Encounter: Payer: Self-pay | Admitting: Internal Medicine

## 2018-01-20 DIAGNOSIS — R519 Headache, unspecified: Secondary | ICD-10-CM | POA: Insufficient documentation

## 2018-01-20 DIAGNOSIS — R51 Headache: Secondary | ICD-10-CM

## 2018-01-20 NOTE — Assessment & Plan Note (Signed)
Follow cbc and ferritin.  

## 2018-01-20 NOTE — Assessment & Plan Note (Signed)
Last mammogram 2017.  Recommended f/u diagnostic (bilateral) mammogram.  Schedule.

## 2018-01-20 NOTE — Assessment & Plan Note (Signed)
Physical today 01/14/18.  PAP 05/2016 - negative with negative HPV.  Mammogram 2017.  Recommended f/u diagnostic bilateral mammogram.  Schedule.

## 2018-01-20 NOTE — Assessment & Plan Note (Signed)
Headaches as outlined.  Treat allergies.  Has had eyes checked recently.  Start mag oxide 400mg  daily.  Follow.

## 2018-01-22 ENCOUNTER — Telehealth: Payer: Self-pay | Admitting: Internal Medicine

## 2018-01-22 NOTE — Telephone Encounter (Signed)
-----   Message from Larry Sierrasrisha L Davis, LPN sent at 16/1/096012/04/2017  1:29 PM EST ----- Regarding: RE: mammogram scheduled Spoke with patient. She is going to call and schedule her own appt so she can look at her calendar.  ----- Message ----- From: Dale DurhamScott, Curtisha Bendix, MD Sent: 01/20/2018   4:57 PM EST To: Larry Sierrasrisha L Davis, LPN Subject: mammogram scheduled                            Needs diagnostic mammogram scheduled.  Was not sure if pt was going to schedule. Overdue.  2017 mammogram recommended diagnostic mammogram due to history of fibrocystic breasts and dense breasts.  Orders placed.  Need to schedule.    Thanks  Dr Lorin PicketScott

## 2018-01-22 NOTE — Telephone Encounter (Signed)
-----   Message from Trisha L Davis, LPN sent at 01/22/2018  1:29 PM EST ----- Regarding: RE: mammogram scheduled Spoke with patient. She is going to call and schedule her own appt so she can look at her calendar.  ----- Message ----- From: Jerol Rufener, MD Sent: 01/20/2018   4:57 PM EST To: Trisha L Davis, LPN Subject: mammogram scheduled                            Needs diagnostic mammogram scheduled.  Was not sure if pt was going to schedule. Overdue.  2017 mammogram recommended diagnostic mammogram due to history of fibrocystic breasts and dense breasts.  Orders placed.  Need to schedule.    Thanks  Dr Chaka Jefferys  

## 2018-01-23 ENCOUNTER — Other Ambulatory Visit (INDEPENDENT_AMBULATORY_CARE_PROVIDER_SITE_OTHER): Payer: BLUE CROSS/BLUE SHIELD

## 2018-01-23 ENCOUNTER — Encounter: Payer: Self-pay | Admitting: Internal Medicine

## 2018-01-23 DIAGNOSIS — Z13220 Encounter for screening for lipoid disorders: Secondary | ICD-10-CM

## 2018-01-23 DIAGNOSIS — E611 Iron deficiency: Secondary | ICD-10-CM | POA: Diagnosis not present

## 2018-01-23 LAB — CBC WITH DIFFERENTIAL/PLATELET
Basophils Absolute: 0 10*3/uL (ref 0.0–0.1)
Basophils Relative: 0.4 % (ref 0.0–3.0)
Eosinophils Absolute: 0.1 10*3/uL (ref 0.0–0.7)
Eosinophils Relative: 1.8 % (ref 0.0–5.0)
HCT: 39.6 % (ref 36.0–46.0)
Hemoglobin: 13.5 g/dL (ref 12.0–15.0)
Lymphocytes Relative: 26.1 % (ref 12.0–46.0)
Lymphs Abs: 1.5 10*3/uL (ref 0.7–4.0)
MCHC: 34 g/dL (ref 30.0–36.0)
MCV: 84.4 fl (ref 78.0–100.0)
MONOS PCT: 8.4 % (ref 3.0–12.0)
Monocytes Absolute: 0.5 10*3/uL (ref 0.1–1.0)
Neutro Abs: 3.7 10*3/uL (ref 1.4–7.7)
Neutrophils Relative %: 63.3 % (ref 43.0–77.0)
PLATELETS: 250 10*3/uL (ref 150.0–400.0)
RBC: 4.69 Mil/uL (ref 3.87–5.11)
RDW: 13.2 % (ref 11.5–15.5)
WBC: 5.9 10*3/uL (ref 4.0–10.5)

## 2018-01-23 LAB — TSH: TSH: 1.19 u[IU]/mL (ref 0.35–4.50)

## 2018-01-23 LAB — COMPREHENSIVE METABOLIC PANEL
ALT: 11 U/L (ref 0–35)
AST: 14 U/L (ref 0–37)
Albumin: 4.3 g/dL (ref 3.5–5.2)
Alkaline Phosphatase: 52 U/L (ref 39–117)
BUN: 16 mg/dL (ref 6–23)
CALCIUM: 9.2 mg/dL (ref 8.4–10.5)
CO2: 29 mEq/L (ref 19–32)
Chloride: 105 mEq/L (ref 96–112)
Creatinine, Ser: 0.72 mg/dL (ref 0.40–1.20)
GFR: 91.55 mL/min (ref >60.00)
GLUCOSE: 101 mg/dL — AB (ref 70–99)
Potassium: 3.9 mEq/L (ref 3.5–5.1)
Sodium: 140 mEq/L (ref 135–145)
Total Bilirubin: 0.5 mg/dL (ref 0.2–1.2)
Total Protein: 7 g/dL (ref 6.0–8.3)

## 2018-01-23 LAB — LIPID PANEL
Cholesterol: 160 mg/dL (ref 0–200)
HDL: 48.6 mg/dL (ref >39.00)
LDL Cholesterol: 102 mg/dL — ABNORMAL HIGH (ref 0–99)
NONHDL: 110.98
Total CHOL/HDL Ratio: 3
Triglycerides: 44 mg/dL (ref 0.0–149.0)
VLDL: 8.8 mg/dL (ref 0.0–40.0)

## 2018-01-23 LAB — FERRITIN: FERRITIN: 26.1 ng/mL (ref 10.0–291.0)

## 2018-03-11 DIAGNOSIS — D485 Neoplasm of uncertain behavior of skin: Secondary | ICD-10-CM | POA: Diagnosis not present

## 2018-03-11 DIAGNOSIS — L821 Other seborrheic keratosis: Secondary | ICD-10-CM | POA: Diagnosis not present

## 2018-03-11 DIAGNOSIS — L820 Inflamed seborrheic keratosis: Secondary | ICD-10-CM | POA: Diagnosis not present

## 2018-03-21 ENCOUNTER — Other Ambulatory Visit: Payer: BLUE CROSS/BLUE SHIELD

## 2018-03-26 ENCOUNTER — Ambulatory Visit
Admission: RE | Admit: 2018-03-26 | Discharge: 2018-03-26 | Disposition: A | Discharge status: Home / Self Care | Payer: BLUE CROSS/BLUE SHIELD | Source: Ambulatory Visit | Attending: Internal Medicine | Admitting: Internal Medicine

## 2018-03-26 DIAGNOSIS — N6019 Diffuse cystic mastopathy of unspecified breast: Secondary | ICD-10-CM

## 2018-03-26 DIAGNOSIS — N6011 Diffuse cystic mastopathy of right breast: Secondary | ICD-10-CM | POA: Diagnosis not present

## 2018-03-26 DIAGNOSIS — R921 Mammographic calcification found on diagnostic imaging of breast: Secondary | ICD-10-CM | POA: Diagnosis not present

## 2018-03-26 DIAGNOSIS — N6012 Diffuse cystic mastopathy of left breast: Secondary | ICD-10-CM | POA: Diagnosis not present

## 2018-07-30 ENCOUNTER — Ambulatory Visit: Payer: BLUE CROSS/BLUE SHIELD | Admitting: Internal Medicine

## 2019-01-15 DIAGNOSIS — H5213 Myopia, bilateral: Secondary | ICD-10-CM | POA: Diagnosis not present

## 2019-08-14 ENCOUNTER — Encounter: Payer: Self-pay | Admitting: Internal Medicine

## 2019-08-14 NOTE — Telephone Encounter (Signed)
I can check cbc,full metabolic panel and thyroid.  Also cholesterol.  If wants Amanda Rojas checked will need diagnosis.  Is she having any symptoms?

## 2019-08-14 NOTE — Telephone Encounter (Signed)
I can order mammogram and routine labs but was not sure if there were certain labs you would want to order to check hormones

## 2019-09-30 ENCOUNTER — Encounter: Payer: Self-pay | Admitting: Internal Medicine

## 2019-09-30 ENCOUNTER — Other Ambulatory Visit: Payer: Self-pay | Admitting: Internal Medicine

## 2019-09-30 DIAGNOSIS — N6019 Diffuse cystic mastopathy of unspecified breast: Secondary | ICD-10-CM

## 2019-09-30 DIAGNOSIS — R232 Flushing: Secondary | ICD-10-CM

## 2019-09-30 DIAGNOSIS — E611 Iron deficiency: Secondary | ICD-10-CM

## 2019-09-30 DIAGNOSIS — Z1231 Encounter for screening mammogram for malignant neoplasm of breast: Secondary | ICD-10-CM

## 2019-09-30 DIAGNOSIS — Z13220 Encounter for screening for lipoid disorders: Secondary | ICD-10-CM

## 2019-09-30 NOTE — Telephone Encounter (Signed)
I have placed the order for the labs.  She will need a fasting lab appt scheduled.  Please notify her that I do not mind ordering the mammogram, but per her my chart message, she is requesting a diagnostic mammogram.  I can try to order with diagnosis of fibrocystic breast, but I am not sure if insurance will cover.  Last mammogram ok and radiology recommended continuing annual screening mammogram.  I have not seen her since 2019.  I am ok to schedule her physical.

## 2019-10-01 NOTE — Telephone Encounter (Signed)
I have placed the order for diagnostic mammogram as pt requested.

## 2019-10-08 ENCOUNTER — Other Ambulatory Visit (INDEPENDENT_AMBULATORY_CARE_PROVIDER_SITE_OTHER): Payer: BC Managed Care – PPO

## 2019-10-08 ENCOUNTER — Other Ambulatory Visit: Payer: Self-pay

## 2019-10-08 DIAGNOSIS — Z13220 Encounter for screening for lipoid disorders: Secondary | ICD-10-CM | POA: Diagnosis not present

## 2019-10-08 DIAGNOSIS — E611 Iron deficiency: Secondary | ICD-10-CM

## 2019-10-08 DIAGNOSIS — R232 Flushing: Secondary | ICD-10-CM

## 2019-10-08 LAB — IBC + FERRITIN
Ferritin: 42.6 ng/mL (ref 10.0–291.0)
Iron: 74 ug/dL (ref 42–145)
Saturation Ratios: 20.5 % (ref 20.0–50.0)
Transferrin: 258 mg/dL (ref 212.0–360.0)

## 2019-10-08 LAB — CBC WITH DIFFERENTIAL/PLATELET
Basophils Absolute: 0 10*3/uL (ref 0.0–0.1)
Basophils Relative: 0.6 % (ref 0.0–3.0)
Eosinophils Absolute: 0.1 10*3/uL (ref 0.0–0.7)
Eosinophils Relative: 2.6 % (ref 0.0–5.0)
HCT: 41.3 % (ref 36.0–46.0)
Hemoglobin: 14 g/dL (ref 12.0–15.0)
Lymphocytes Relative: 32.2 % (ref 12.0–46.0)
Lymphs Abs: 1.7 10*3/uL (ref 0.7–4.0)
MCHC: 33.9 g/dL (ref 30.0–36.0)
MCV: 83.8 fl (ref 78.0–100.0)
Monocytes Absolute: 0.4 10*3/uL (ref 0.1–1.0)
Monocytes Relative: 8.2 % (ref 3.0–12.0)
Neutro Abs: 3 10*3/uL (ref 1.4–7.7)
Neutrophils Relative %: 56.4 % (ref 43.0–77.0)
Platelets: 206 10*3/uL (ref 150.0–400.0)
RBC: 4.93 Mil/uL (ref 3.87–5.11)
RDW: 12.7 % (ref 11.5–15.5)
WBC: 5.3 10*3/uL (ref 4.0–10.5)

## 2019-10-08 LAB — LIPID PANEL
Cholesterol: 140 mg/dL (ref 0–200)
HDL: 50.6 mg/dL (ref >39.00)
LDL Cholesterol: 77 mg/dL (ref 0–99)
NonHDL: 89.25
Total CHOL/HDL Ratio: 3
Triglycerides: 59 mg/dL (ref 0.0–149.0)
VLDL: 11.8 mg/dL (ref 0.0–40.0)

## 2019-10-08 LAB — COMPREHENSIVE METABOLIC PANEL
ALT: 10 U/L (ref 0–35)
AST: 14 U/L (ref 0–37)
Albumin: 4.5 g/dL (ref 3.5–5.2)
Alkaline Phosphatase: 70 U/L (ref 39–117)
BUN: 14 mg/dL (ref 6–23)
CO2: 31 mEq/L (ref 19–32)
Calcium: 9.5 mg/dL (ref 8.4–10.5)
Chloride: 103 mEq/L (ref 96–112)
Creatinine, Ser: 0.79 mg/dL (ref 0.40–1.20)
GFR: 76.85 mL/min (ref >60.00)
Glucose, Bld: 97 mg/dL (ref 70–99)
Potassium: 3.8 mEq/L (ref 3.5–5.1)
Sodium: 142 mEq/L (ref 135–145)
Total Bilirubin: 0.8 mg/dL (ref 0.2–1.2)
Total Protein: 7.1 g/dL (ref 6.0–8.3)

## 2019-10-08 LAB — FOLLICLE STIMULATING HORMONE: FSH: 58.5 m[IU]/mL

## 2019-10-08 LAB — TSH: TSH: 1.88 u[IU]/mL (ref 0.35–4.50)

## 2019-10-28 NOTE — Telephone Encounter (Signed)
Called patient to let her jnow that I have reached out to [facility].I will try again in the morning and keep her updated.

## 2019-11-06 ENCOUNTER — Other Ambulatory Visit: Payer: Self-pay | Admitting: Internal Medicine

## 2019-11-06 DIAGNOSIS — N6019 Diffuse cystic mastopathy of unspecified breast: Secondary | ICD-10-CM

## 2019-11-06 DIAGNOSIS — Z1231 Encounter for screening mammogram for malignant neoplasm of breast: Secondary | ICD-10-CM

## 2019-11-12 ENCOUNTER — Ambulatory Visit
Admission: RE | Admit: 2019-11-12 | Discharge: 2019-11-12 | Disposition: A | Discharge status: Home / Self Care | Payer: BC Managed Care – PPO | Source: Ambulatory Visit | Attending: Internal Medicine | Admitting: Internal Medicine

## 2019-11-12 ENCOUNTER — Other Ambulatory Visit: Payer: Self-pay

## 2019-11-12 DIAGNOSIS — N6019 Diffuse cystic mastopathy of unspecified breast: Secondary | ICD-10-CM | POA: Insufficient documentation

## 2019-11-12 DIAGNOSIS — R922 Inconclusive mammogram: Secondary | ICD-10-CM | POA: Diagnosis not present

## 2019-11-12 DIAGNOSIS — Z1231 Encounter for screening mammogram for malignant neoplasm of breast: Secondary | ICD-10-CM | POA: Diagnosis not present

## 2019-11-12 DIAGNOSIS — N6002 Solitary cyst of left breast: Secondary | ICD-10-CM | POA: Diagnosis not present

## 2019-11-20 ENCOUNTER — Encounter: Payer: Self-pay | Admitting: Internal Medicine

## 2019-11-20 NOTE — Telephone Encounter (Signed)
Rescheduled

## 2019-11-26 ENCOUNTER — Encounter: Payer: Self-pay | Admitting: Internal Medicine

## 2019-12-05 ENCOUNTER — Encounter: Payer: BLUE CROSS/BLUE SHIELD | Admitting: Internal Medicine

## 2019-12-29 ENCOUNTER — Other Ambulatory Visit: Payer: Self-pay

## 2019-12-29 ENCOUNTER — Other Ambulatory Visit (HOSPITAL_COMMUNITY)
Admission: RE | Admit: 2019-12-29 | Discharge: 2019-12-29 | Disposition: A | Discharge status: Home / Self Care | Payer: BC Managed Care – PPO | Source: Ambulatory Visit | Attending: Internal Medicine | Admitting: Internal Medicine

## 2019-12-29 ENCOUNTER — Ambulatory Visit (INDEPENDENT_AMBULATORY_CARE_PROVIDER_SITE_OTHER): Payer: BC Managed Care – PPO | Admitting: Internal Medicine

## 2019-12-29 VITALS — BP 118/70 | HR 67 | Temp 98.3°F | Resp 16 | Ht 66.0 in | Wt 135.0 lb

## 2019-12-29 DIAGNOSIS — Z124 Encounter for screening for malignant neoplasm of cervix: Secondary | ICD-10-CM | POA: Insufficient documentation

## 2019-12-29 DIAGNOSIS — E611 Iron deficiency: Secondary | ICD-10-CM

## 2019-12-29 DIAGNOSIS — N6002 Solitary cyst of left breast: Secondary | ICD-10-CM | POA: Diagnosis not present

## 2019-12-29 DIAGNOSIS — Z0000 Encounter for general adult medical examination without abnormal findings: Secondary | ICD-10-CM | POA: Diagnosis not present

## 2019-12-29 NOTE — Progress Notes (Signed)
Patient ID: Breonna Freda Jackson, female   DOB: September 01, 1970, 49 y.o.   MRN: [id number]   Subjective:    Patient ID: Saleah Freda Jackson, female    DOB: November 11, 1970, 89 y.o.   MRN: [id number]  HPI This visit occurred during the SARS-CoV-2 public health emergency.  Safety protocols were in place, including screening questions prior to the visit, additional usage of staff PPE, and extensive cleaning of exam room while observing appropriate contact time as indicated for disinfecting solutions.  Patient here for her physical exam.  She is doing well.  Exercising.  Has adjusted her diet.  Lost weight.  No chest pain or sob reported.  Allergies controlled with claritin and nasal spray.  No acid reflux.  Taking miralax - to help keep bowels regular.  No period since 5-07/2019. Some hot flashes. Tolerating.  Overall feels good.  Working from home. This is going well. Handling stress.     Past Medical History:  Diagnosis Date  . History of HPV infection    abnormal pap s/p cryosurgery  . Hx of migraine headaches    menstrual migraines  . Kidney stones    Past Surgical History:  Procedure Laterality Date  . WRIST FRACTURE SURGERY  1996   metal plate placed   Family History  Problem Relation Age of Onset  . Arthritis Mother   . Thyroid disease Mother   . Heart disease Maternal Grandmother   . Breast cancer Cousin 37       subsequently developed acute leukemia  . Prostate cancer Neg Hx   . Bladder Cancer Neg Hx    Social History   Socioeconomic History  . Marital status: Married    Spouse name: Not on file  . Number of children: 3  . Years of education: Not on file  . Highest education level: Not on file  Occupational History    Employer: [employer]  Tobacco Use  . Smoking status: Never Smoker  . Smokeless tobacco: Never Used  Substance and Sexual Activity  . Alcohol use: Yes    Alcohol/week: 0.0 standard drinks  . Drug use: No  . Sexual activity: Not on file  Other Topics Concern    . Not on file  Social History Narrative  . Not on file   Social Determinants of Health   Financial Resource Strain:   . Difficulty of Paying Living Expenses: Not on file  Food Insecurity:   . Worried About Programme researcher, broadcasting/film/video in the Last Year: Not on file  . Ran Out of Food in the Last Year: Not on file  Transportation Needs:   . Lack of Transportation (Medical): Not on file  . Lack of Transportation (Non-Medical): Not on file  Physical Activity:   . Days of Exercise per Week: Not on file  . Minutes of Exercise per Session: Not on file  Stress:   . Feeling of Stress : Not on file  Social Connections:   . Frequency of Communication with Friends and Family: Not on file  . Frequency of Social Gatherings with Friends and Family: Not on file  . Attends Religious Services: Not on file  . Active Member of Clubs or Organizations: Not on file  . Attends Banker Meetings: Not on file  . Marital Status: Not on file    Outpatient Encounter Medications as of 12/29/2019  Medication Sig  . Multiple Vitamins-Minerals (MULTIVITAMIN ADULTS 50+ PO) Take 1 tablet by mouth daily.  . ferrous sulfate 325 (65  FE) MG tablet Take 325 mg by mouth daily with breakfast.  . fluticasone (FLONASE) 50 MCG/ACT nasal spray Place 2 sprays into both nostrils daily.  Marland Kitchen loratadine (CLARITIN) 10 MG tablet Take 10 mg by mouth daily.  . magnesium oxide (MAG-OX) 400 MG tablet One tablet per day   No facility-administered encounter medications on file as of 12/29/2019.    Review of Systems  Constitutional: Negative for appetite change and unexpected weight change.  HENT: Negative for congestion, sinus pressure and sore throat.   Eyes: Negative for pain and visual disturbance.  Respiratory: Negative for cough, chest tightness and shortness of breath.   Cardiovascular: Negative for chest pain, palpitations and leg swelling.  Gastrointestinal: Negative for abdominal pain, diarrhea, nausea and vomiting.   Genitourinary: Negative for difficulty urinating and dysuria.  Musculoskeletal: Negative for joint swelling and myalgias.  Skin: Negative for color change and rash.  Neurological: Negative for dizziness, light-headedness and headaches.  Hematological: Negative for adenopathy. Does not bruise/bleed easily.  Psychiatric/Behavioral: Negative for agitation and dysphoric mood.       Objective:    Physical Exam Constitutional:      General: She is not in acute distress.    Appearance: Normal appearance.  HENT:     Head: Normocephalic and atraumatic.     Right Ear: External ear normal.     Left Ear: External ear normal.  Eyes:     General: No scleral icterus.       Right eye: No discharge.        Left eye: No discharge.     Conjunctiva/sclera: Conjunctivae normal.  Neck:     Thyroid: No thyromegaly.  Cardiovascular:     Rate and Rhythm: Normal rate and regular rhythm.  Pulmonary:     Effort: No respiratory distress.     Breath sounds: Normal breath sounds. No wheezing.     Comments: Breasts: no nipple discharge or nipple retraction present.  Increased fullness left breast 12-3:00 region.  No other palpable nodules or axillary adenopathy.  Abdominal:     General: Bowel sounds are normal.     Palpations: Abdomen is soft.     Tenderness: There is no abdominal tenderness.  Genitourinary:    Comments: Normal external genitalia.  Vaginal vault without lesions.  Cervix identified.  Pap smear performed.  Could not appreciate any adnexal masses or tenderness.   Musculoskeletal:        General: No swelling or tenderness.     Cervical back: Neck supple.  Lymphadenopathy:     Cervical: No cervical adenopathy.  Skin:    Findings: No erythema or rash.  Neurological:     Mental Status: She is alert.  Psychiatric:        Mood and Affect: Mood normal.        Behavior: Behavior normal.     BP 118/70   Pulse 67   Temp 98.3 F (36.8 C) (Oral)   Resp 16   Ht 5\' 6"  (1.676 m)   Wt 135  lb (61.2 kg)   SpO2 99%   BMI 21.79 kg/m  Wt Readings from Last 3 Encounters:  12/29/19 135 lb (61.2 kg)  01/14/18 152 lb 9.6 oz (69.2 kg)  05/29/16 147 lb 6.4 oz (66.9 kg)     Lab Results  Component Value Date   WBC 5.3 10/08/2019   HGB 14.0 10/08/2019   HCT 41.3 10/08/2019   PLT 206.0 10/08/2019   GLUCOSE 97 10/08/2019   CHOL 140 10/08/2019  TRIG 59.0 10/08/2019   HDL 50.60 10/08/2019   LDLCALC 77 10/08/2019   ALT 10 10/08/2019   AST 14 10/08/2019   NA 142 10/08/2019   K 3.8 10/08/2019   CL 103 10/08/2019   CREATININE 0.79 10/08/2019   BUN 14 10/08/2019   CO2 31 10/08/2019   TSH 1.88 10/08/2019    US BREAST LTD UNI LEFT INC AXILLA  Result Date: 11/12/2019 CLINICAL DATA:  49 year old female with enlarging left breast cyst. The patient states she has lost approximately 20 pounds in the interval. She would like to consider aspiration of her known left breast cyst at this time. She is due for her annual bilateral mammogram today as well. EXAM: DIGITAL DIAGNOSTIC BILATERAL MAMMOGRAM WITH CAD AND TOMO ULTRASOUND LEFT BREAST COMPARISON:  Previous exam(s). ACR Breast Density Category c: The breast tissue is heterogeneously dense, which may obscure small masses. FINDINGS: A radiopaque BB was placed at the site of the patient's palpable lump in the upper left breast. A round, circumscribed equal density mass is seen deep to the radiopaque BB. Additional smaller, similar appearing masses are noted in the vicinity. No additional suspicious mammographic findings are identified in either breast. Mammographic images were processed with CAD. Targeted ultrasound is performed, showing an oval, circumscribed anechoic mass at the 1 o'clock position 1 cm from the nipple. It measures 4 x 3.7 x 2.9 cm (previously 3.6 x 3.5 x 1.5 cm). No additional suspicious findings identified. IMPRESSION: 1. Enlarging left breast simple cyst corresponding with the patient's palpable lump. 2. Otherwise, no  mammographic evidence of malignancy in either breast. RECOMMENDATION: 1. The patient wishes to proceed with ultrasound-guided aspiration of her enlarging left breast cyst for symptomatic relief. This will be scheduled at her convenience. 2. Routine annual screening is recommended in 1 year. I have discussed the findings and recommendations with the patient. If applicable, a reminder letter will be sent to the patient regarding the next appointment. BI-RADS CATEGORY  2: Benign. Electronically Signed   By: Sande Brothers M.D.   On: 11/12/2019 15:52   MM DIAG BREAST TOMO BILATERAL  Result Date: 11/12/2019 CLINICAL DATA:  49 year old female with enlarging left breast cyst. The patient states she has lost approximately 20 pounds in the interval. She would like to consider aspiration of her known left breast cyst at this time. She is due for her annual bilateral mammogram today as well. EXAM: DIGITAL DIAGNOSTIC BILATERAL MAMMOGRAM WITH CAD AND TOMO ULTRASOUND LEFT BREAST COMPARISON:  Previous exam(s). ACR Breast Density Category c: The breast tissue is heterogeneously dense, which may obscure small masses. FINDINGS: A radiopaque BB was placed at the site of the patient's palpable lump in the upper left breast. A round, circumscribed equal density mass is seen deep to the radiopaque BB. Additional smaller, similar appearing masses are noted in the vicinity. No additional suspicious mammographic findings are identified in either breast. Mammographic images were processed with CAD. Targeted ultrasound is performed, showing an oval, circumscribed anechoic mass at the 1 o'clock position 1 cm from the nipple. It measures 4 x 3.7 x 2.9 cm (previously 3.6 x 3.5 x 1.5 cm). No additional suspicious findings identified. IMPRESSION: 1. Enlarging left breast simple cyst corresponding with the patient's palpable lump. 2. Otherwise, no mammographic evidence of malignancy in either breast. RECOMMENDATION: 1. The patient wishes to  proceed with ultrasound-guided aspiration of her enlarging left breast cyst for symptomatic relief. This will be scheduled at her convenience. 2. Routine annual screening is recommended in 1  year. I have discussed the findings and recommendations with the patient. If applicable, a reminder letter will be sent to the patient regarding the next appointment. BI-RADS CATEGORY  2: Benign. Electronically Signed   By: Sande BrothersSerena  Chacko M.D.   On: 11/12/2019 15:52       Assessment & Plan:   Problem List Items Addressed This Visit    Iron deficiency    Follow cbc and iron studies.       Health care maintenance    Physical today 12/29/19.  Mammogram 11/12/19 - Birads II.  Colon cancer screening.       Breast cyst    Persistent.  Recent mammogram ok.  Follow.  Desires no further intervention.         Other Visit Diagnoses    Routine general medical examination at a health care facility    -  Primary   Cervical cancer screening       Relevant Orders   Cytology - PAP( [facility]) (Completed)       Dale Durhamharlene Stephan Nelis, MD

## 2019-12-30 LAB — CYTOLOGY - PAP
Comment: NEGATIVE
Diagnosis: NEGATIVE
High risk HPV: NEGATIVE

## 2020-01-03 ENCOUNTER — Encounter: Payer: Self-pay | Admitting: Internal Medicine

## 2020-01-03 NOTE — Assessment & Plan Note (Addendum)
Physical today 12/29/19.  Mammogram 11/12/19 - Birads II.  Colon cancer screening.

## 2020-01-03 NOTE — Assessment & Plan Note (Signed)
Follow cbc and iron studies.  

## 2020-01-03 NOTE — Assessment & Plan Note (Signed)
Persistent.  Recent mammogram ok.  Follow.  Desires no further intervention.

## 2020-03-02 DIAGNOSIS — H5213 Myopia, bilateral: Secondary | ICD-10-CM | POA: Diagnosis not present

## 2020-05-19 ENCOUNTER — Encounter: Payer: Self-pay | Admitting: Internal Medicine

## 2020-05-19 DIAGNOSIS — N921 Excessive and frequent menstruation with irregular cycle: Secondary | ICD-10-CM

## 2020-05-19 NOTE — Telephone Encounter (Signed)
If she wants to take a multivitamin, a good general multivitamin is centrum - one per day.  (this is just a general multivitamin).  Also, per note, she had not had a period since 06/2019.  Per review of my chart, periods now q 3 weeks since 01/2020.  Given increased bleeding can refer to gyn for further evaluation and question of need for pelvic ultrasound.

## 2020-05-21 NOTE — Telephone Encounter (Signed)
Order placed for gyn referral.  

## 2020-05-21 NOTE — Telephone Encounter (Signed)
Patient aware and would like to be sent to gyn to further evaluate

## 2020-08-10 ENCOUNTER — Telehealth: Payer: Self-pay

## 2020-08-10 DIAGNOSIS — Z13220 Encounter for screening for lipoid disorders: Secondary | ICD-10-CM

## 2020-08-10 DIAGNOSIS — E611 Iron deficiency: Secondary | ICD-10-CM

## 2020-08-10 DIAGNOSIS — R5383 Other fatigue: Secondary | ICD-10-CM

## 2020-08-10 NOTE — Telephone Encounter (Signed)
Future labs ordered. Needs fasting labs.

## 2020-09-06 ENCOUNTER — Ambulatory Visit: Payer: BC Managed Care – PPO | Admitting: Internal Medicine

## 2020-09-10 ENCOUNTER — Encounter: Payer: Self-pay | Admitting: Internal Medicine

## 2020-09-10 ENCOUNTER — Ambulatory Visit (INDEPENDENT_AMBULATORY_CARE_PROVIDER_SITE_OTHER): Payer: BC Managed Care – PPO

## 2020-09-10 ENCOUNTER — Other Ambulatory Visit: Payer: Self-pay

## 2020-09-10 ENCOUNTER — Ambulatory Visit: Payer: BC Managed Care – PPO | Admitting: Internal Medicine

## 2020-09-10 DIAGNOSIS — M25552 Pain in left hip: Secondary | ICD-10-CM

## 2020-09-10 DIAGNOSIS — R232 Flushing: Secondary | ICD-10-CM | POA: Diagnosis not present

## 2020-09-10 MED ORDER — MELOXICAM 7.5 MG PO TABS: 7.5000 mg | ORAL_TABLET | Freq: Every day | ORAL | 0 refills | Status: DC

## 2020-09-10 NOTE — Progress Notes (Signed)
Patient ID: Amanda Rojas, female   DOB: 03/29/1970, 50 y.o.   MRN: [id number]   Subjective:    Patient ID: Amanda Rojas, female    DOB: 26-Oct-1970, 50 y.o.   MRN: [id number]  HPI This visit occurred during the SARS-CoV-2 public health emergency.  Safety protocols were in place, including screening questions prior to the visit, additional usage of staff PPE, and extensive cleaning of exam room while observing appropriate contact time as indicated for disinfecting solutions.   Patient here for work in appt with left hip pain.  Present for 4-5 months.  Feels like a burning pain - sensation at times.  Localized - left lateral hip.  No groin pain and no low back pain.  No urine or bowel change.  Hurts to lie on left side.  Hurts to palpate.  Ist still exercising (cardio) - 1-2x/week.  No chest pain or sob with increased activity - reported.  Hip can ache while sitting.  Has taken ibuprofen.  LMP 3-05/2020.  Reports having intermittent hot flashes.    Past Medical History:  Diagnosis Date   History of HPV infection    abnormal pap s/p cryosurgery   Hx of migraine headaches    menstrual migraines   Kidney stones    Past Surgical History:  Procedure Laterality Date   WRIST FRACTURE SURGERY  1996   metal plate placed   Family History  Problem Relation Age of Onset   Arthritis Mother    Thyroid disease Mother    Heart disease Maternal Grandmother    Breast cancer Cousin 7       subsequently developed acute leukemia   Prostate cancer Neg Hx    Bladder Cancer Neg Hx    Social History   Socioeconomic History   Marital status: Married    Spouse name: Not on file   Number of children: 3   Years of education: Not on file   Highest education level: Not on file  Occupational History    Employer: [employer]  Tobacco Use   Smoking status: Never   Smokeless tobacco: Never  Substance and Sexual Activity   Alcohol use: Yes    Alcohol/week: 0.0 standard drinks   Drug use: No    Sexual activity: Not on file  Other Topics Concern   Not on file  Social History Narrative   Not on file   Social Determinants of Health   Financial Resource Strain: Not on file  Food Insecurity: Not on file  Transportation Needs: Not on file  Physical Activity: Not on file  Stress: Not on file  Social Connections: Not on file    Review of Systems  Constitutional:  Negative for appetite change and unexpected weight change.  HENT:  Negative for congestion and sinus pressure.   Respiratory:  Negative for cough, chest tightness and shortness of breath.   Cardiovascular:  Negative for chest pain, palpitations and leg swelling.  Gastrointestinal:  Negative for abdominal pain, diarrhea, nausea and vomiting.  Genitourinary:  Negative for difficulty urinating and dysuria.  Musculoskeletal:  Negative for joint swelling and myalgias.       Left hip pain as outlined.    Skin:  Negative for color change and rash.  Neurological:  Negative for dizziness, light-headedness and headaches.  Psychiatric/Behavioral:  Negative for agitation and dysphoric mood.       Objective:    Physical Exam Vitals reviewed.  Constitutional:      General: She is not in acute  distress.    Appearance: Normal appearance.  HENT:     Head: Normocephalic and atraumatic.     Right Ear: External ear normal.     Left Ear: External ear normal.  Eyes:     General: No scleral icterus.       Right eye: No discharge.        Left eye: No discharge.     Conjunctiva/sclera: Conjunctivae normal.  Neck:     Thyroid: No thyromegaly.  Cardiovascular:     Rate and Rhythm: Normal rate and regular rhythm.  Pulmonary:     Effort: No respiratory distress.     Breath sounds: Normal breath sounds. No wheezing.  Abdominal:     General: Bowel sounds are normal.     Palpations: Abdomen is soft.     Tenderness: There is no abdominal tenderness.  Musculoskeletal:        General: No swelling or tenderness.     Cervical back:  Neck supple. No tenderness.     Comments: Negative SLR.  Increased pain to palpation left lateral hip.  No pain with adduction or abduction.  No pain with rotation of upper leg.   Lymphadenopathy:     Cervical: No cervical adenopathy.  Skin:    Findings: No erythema or rash.  Neurological:     Mental Status: She is alert.  Psychiatric:        Mood and Affect: Mood normal.        Behavior: Behavior normal.    BP 136/82 (BP Location: Left Arm, Patient Position: Sitting)   Pulse 75   Temp (!) 97.4 F (36.3 C)   Ht 5' 5.98" (1.676 m)   Wt 141 lb 12.8 oz (64.3 kg)   SpO2 98%   BMI 22.90 kg/m  Wt Readings from Last 3 Encounters:  09/10/20 141 lb 12.8 oz (64.3 kg)  12/29/19 135 lb (61.2 kg)  01/14/18 152 lb 9.6 oz (69.2 kg)    Outpatient Encounter Medications as of 09/10/2020  Medication Sig   meloxicam (MOBIC) 7.5 MG tablet Take 1 tablet (7.5 mg total) by mouth daily.   fluticasone (FLONASE) 50 MCG/ACT nasal spray Place 2 sprays into both nostrils daily.   [DISCONTINUED] ferrous sulfate 325 (65 FE) MG tablet Take 325 mg by mouth daily with breakfast. (Patient not taking: Reported on 09/10/2020)   [DISCONTINUED] loratadine (CLARITIN) 10 MG tablet Take 10 mg by mouth daily. (Patient not taking: Reported on 09/10/2020)   [DISCONTINUED] magnesium oxide (MAG-OX) 400 MG tablet One tablet per day (Patient not taking: Reported on 09/10/2020)   [DISCONTINUED] Multiple Vitamins-Minerals (MULTIVITAMIN ADULTS 50+ PO) Take 1 tablet by mouth daily. (Patient not taking: Reported on 09/10/2020)   No facility-administered encounter medications on file as of 09/10/2020.     Lab Results  Component Value Date   WBC 5.3 10/08/2019   HGB 14.0 10/08/2019   HCT 41.3 10/08/2019   PLT 206.0 10/08/2019   GLUCOSE 97 10/08/2019   CHOL 140 10/08/2019   TRIG 59.0 10/08/2019   HDL 50.60 10/08/2019   LDLCALC 77 10/08/2019   ALT 10 10/08/2019   AST 14 10/08/2019   NA 142 10/08/2019   K 3.8 10/08/2019   CL  103 10/08/2019   CREATININE 0.79 10/08/2019   BUN 14 10/08/2019   CO2 31 10/08/2019   TSH 1.88 10/08/2019       Assessment & Plan:   Problem List Items Addressed This Visit     Hot flashes  No period since 3-05/2020.  Hot flashes.  Keep menstrual diary.  Husband has had vasectomy.         Left hip pain    Persistent hip pain as outlined.  Exam appears to be more c/w bursitis.  meloxicam as directed.  Discussed possible side effects.  Check xray.  Discussed possible therapy.         Relevant Orders   DG Hip Unilat W OR W/O Pelvis 2-3 Views Left (Completed)     Dale , MD

## 2020-09-12 ENCOUNTER — Encounter: Payer: Self-pay | Admitting: Internal Medicine

## 2020-09-12 DIAGNOSIS — R232 Flushing: Secondary | ICD-10-CM | POA: Insufficient documentation

## 2020-09-12 NOTE — Assessment & Plan Note (Signed)
No period since 3-05/2020.  Hot flashes.  Keep menstrual diary.  Husband has had vasectomy.

## 2020-09-12 NOTE — Assessment & Plan Note (Signed)
Persistent hip pain as outlined.  Exam appears to be more c/w bursitis.  meloxicam as directed.  Discussed possible side effects.  Check xray.  Discussed possible therapy.

## 2020-10-07 ENCOUNTER — Telehealth: Payer: Self-pay | Admitting: Internal Medicine

## 2020-10-07 NOTE — Telephone Encounter (Signed)
Rejection Reason - Patient did not respond" Particia Jasper said on Oct 07, 2020 4:46 PM  "Adelina Mings left message for patient to call and schedule." Particia Jasper said on Aug 18, 2020 2:25 PM  "Adelina Mings left a voicemail on 06/09/20." Particia Jasper said on Jun 10, 2020 11:06 AM  Msg from [facility] ob/gyn

## 2020-11-25 ENCOUNTER — Other Ambulatory Visit: Payer: Self-pay

## 2020-11-25 ENCOUNTER — Other Ambulatory Visit (INDEPENDENT_AMBULATORY_CARE_PROVIDER_SITE_OTHER): Payer: BC Managed Care – PPO

## 2020-11-25 DIAGNOSIS — R5383 Other fatigue: Secondary | ICD-10-CM | POA: Diagnosis not present

## 2020-11-25 DIAGNOSIS — Z13220 Encounter for screening for lipoid disorders: Secondary | ICD-10-CM | POA: Diagnosis not present

## 2020-11-25 DIAGNOSIS — E611 Iron deficiency: Secondary | ICD-10-CM | POA: Diagnosis not present

## 2020-11-25 LAB — COMPREHENSIVE METABOLIC PANEL
ALT: 12 U/L (ref 0–35)
AST: 17 U/L (ref 0–37)
Albumin: 4.4 g/dL (ref 3.5–5.2)
Alkaline Phosphatase: 59 U/L (ref 39–117)
BUN: 14 mg/dL (ref 6–23)
CO2: 32 mEq/L (ref 19–32)
Calcium: 9.4 mg/dL (ref 8.4–10.5)
Chloride: 102 mEq/L (ref 96–112)
Creatinine, Ser: 0.79 mg/dL (ref 0.40–1.20)
GFR: 86.43 mL/min (ref >60.00)
Glucose, Bld: 113 mg/dL — ABNORMAL HIGH (ref 70–99)
Potassium: 4.1 mEq/L (ref 3.5–5.1)
Sodium: 140 mEq/L (ref 135–145)
Total Bilirubin: 0.8 mg/dL (ref 0.2–1.2)
Total Protein: 6.8 g/dL (ref 6.0–8.3)

## 2020-11-25 LAB — CBC WITH DIFFERENTIAL/PLATELET
Basophils Absolute: 0 10*3/uL (ref 0.0–0.1)
Basophils Relative: 0.6 % (ref 0.0–3.0)
Eosinophils Absolute: 0.1 10*3/uL (ref 0.0–0.7)
Eosinophils Relative: 2.4 % (ref 0.0–5.0)
HCT: 40.4 % (ref 36.0–46.0)
Hemoglobin: 13.7 g/dL (ref 12.0–15.0)
Lymphocytes Relative: 30.6 % (ref 12.0–46.0)
Lymphs Abs: 1.8 10*3/uL (ref 0.7–4.0)
MCHC: 33.8 g/dL (ref 30.0–36.0)
MCV: 84.5 fl (ref 78.0–100.0)
Monocytes Absolute: 0.5 10*3/uL (ref 0.1–1.0)
Monocytes Relative: 7.9 % (ref 3.0–12.0)
Neutro Abs: 3.4 10*3/uL (ref 1.4–7.7)
Neutrophils Relative %: 58.5 % (ref 43.0–77.0)
Platelets: 213 10*3/uL (ref 150.0–400.0)
RBC: 4.79 Mil/uL (ref 3.87–5.11)
RDW: 13.2 % (ref 11.5–15.5)
WBC: 5.8 10*3/uL (ref 4.0–10.5)

## 2020-11-25 LAB — LIPID PANEL
Cholesterol: 174 mg/dL (ref 0–200)
HDL: 66.6 mg/dL (ref >39.00)
LDL Cholesterol: 98 mg/dL (ref 0–99)
NonHDL: 107.55
Total CHOL/HDL Ratio: 3
Triglycerides: 49 mg/dL (ref 0.0–149.0)
VLDL: 9.8 mg/dL (ref 0.0–40.0)

## 2020-11-25 LAB — FERRITIN: Ferritin: 67.9 ng/mL (ref 10.0–291.0)

## 2020-11-25 LAB — TSH: TSH: 1.47 u[IU]/mL (ref 0.35–5.50)

## 2020-11-29 ENCOUNTER — Encounter: Payer: Self-pay | Admitting: Internal Medicine

## 2020-11-29 ENCOUNTER — Ambulatory Visit (INDEPENDENT_AMBULATORY_CARE_PROVIDER_SITE_OTHER): Payer: BC Managed Care – PPO | Admitting: Internal Medicine

## 2020-11-29 ENCOUNTER — Other Ambulatory Visit: Payer: Self-pay

## 2020-11-29 VITALS — BP 122/74 | HR 76 | Temp 97.9°F | Resp 16 | Ht 66.0 in | Wt 143.6 lb

## 2020-11-29 DIAGNOSIS — R232 Flushing: Secondary | ICD-10-CM | POA: Diagnosis not present

## 2020-11-29 DIAGNOSIS — R351 Nocturia: Secondary | ICD-10-CM

## 2020-11-29 DIAGNOSIS — Z0000 Encounter for general adult medical examination without abnormal findings: Secondary | ICD-10-CM

## 2020-11-29 DIAGNOSIS — Z1211 Encounter for screening for malignant neoplasm of colon: Secondary | ICD-10-CM

## 2020-11-29 DIAGNOSIS — M25552 Pain in left hip: Secondary | ICD-10-CM

## 2020-11-29 DIAGNOSIS — Z1231 Encounter for screening mammogram for malignant neoplasm of breast: Secondary | ICD-10-CM

## 2020-11-29 DIAGNOSIS — E611 Iron deficiency: Secondary | ICD-10-CM

## 2020-11-29 NOTE — Assessment & Plan Note (Addendum)
Physical today 11/29/20.  Mammogram 11/12/19 - Birads II.  Schedule f/u mammogram.  Discussed due colonoscopy.  Refer to GI.

## 2020-11-29 NOTE — Progress Notes (Signed)
Patient ID: Amanda Rojas, female   DOB: Feb 22, 1970, 50 y.o.   MRN: [id number]   Subjective:    Patient ID: Amanda Rojas, female    DOB: April 06, 1970, 50 y.o.   MRN: [id number]  This visit occurred during the SARS-CoV-2 public health emergency.  Safety protocols were in place, including screening questions prior to the visit, additional usage of staff PPE, and extensive cleaning of exam room while observing appropriate contact time as indicated for disinfecting solutions.   Patient here for her physical exam.   Chief Complaint  Patient presents with   Annual Exam   .   HPI Handling stress.  Stress is better.  Hip is better.  Staying active.  No chest pain or sob reported.  Has adjusted diet.  Has lost weight.  No chest pain or sob reported.  No abdominal pain.  Bowels moving.  Miralax - keeps regular.  LMP 09/17/20 - 09/28/20 - regular period.  10/10/20 - light/spotting.  No period since.  Some hot flashes.  Discussed due colonoscopy.  Some nocturia.  Discussed sleep apnea.  Discussed - spot check blood pressure.     Past Medical History:  Diagnosis Date   History of HPV infection    abnormal pap s/p cryosurgery   Hx of migraine headaches    menstrual migraines   Kidney stones    Past Surgical History:  Procedure Laterality Date   WRIST FRACTURE SURGERY  1996   metal plate placed   Family History  Problem Relation Age of Onset   Arthritis Mother    Thyroid disease Mother    Heart disease Maternal Grandmother    Breast cancer Cousin 31       subsequently developed acute leukemia   Prostate cancer Neg Hx    Bladder Cancer Neg Hx    Social History   Socioeconomic History   Marital status: Married    Spouse name: Not on file   Number of children: 3   Years of education: Not on file   Highest education level: Not on file  Occupational History    Employer: [employer]  Tobacco Use   Smoking status: Never   Smokeless tobacco: Never  Substance and Sexual Activity    Alcohol use: Yes    Alcohol/week: 0.0 standard drinks   Drug use: No   Sexual activity: Not on file  Other Topics Concern   Not on file  Social History Narrative   Not on file   Social Determinants of Health   Financial Resource Strain: Not on file  Food Insecurity: Not on file  Transportation Needs: Not on file  Physical Activity: Not on file  Stress: Not on file  Social Connections: Not on file     Review of Systems  Constitutional:  Negative for appetite change and unexpected weight change.  HENT:  Negative for congestion, sinus pressure and sore throat.   Eyes:  Negative for pain and visual disturbance.  Respiratory:  Negative for cough, chest tightness and shortness of breath.   Cardiovascular:  Negative for chest pain, palpitations and leg swelling.  Gastrointestinal:  Negative for abdominal pain, diarrhea, nausea and vomiting.       Miralax - keeps bowels moving.   Genitourinary:  Negative for difficulty urinating and dysuria.  Musculoskeletal:  Negative for joint swelling and myalgias.  Skin:  Negative for color change and rash.  Neurological:  Negative for dizziness, light-headedness and headaches.  Hematological:  Negative for adenopathy. Does not bruise/bleed easily.  Psychiatric/Behavioral:  Negative for agitation and dysphoric mood.       Objective:     BP 122/74   Pulse 76   Temp 97.9 F (36.6 C)   Resp 16   Ht 5\' 6"  (1.676 m)   Wt 143 lb 9.6 oz (65.1 kg)   SpO2 99%   BMI 23.18 kg/m  Wt Readings from Last 3 Encounters:  11/29/20 143 lb 9.6 oz (65.1 kg)  09/10/20 141 lb 12.8 oz (64.3 kg)  12/29/19 135 lb (61.2 kg)    Physical Exam Vitals reviewed.  Constitutional:      General: She is not in acute distress.    Appearance: Normal appearance.  HENT:     Head: Normocephalic and atraumatic.     Right Ear: External ear normal.     Left Ear: External ear normal.  Eyes:     General: No scleral icterus.       Right eye: No discharge.        Left  eye: No discharge.     Conjunctiva/sclera: Conjunctivae normal.  Neck:     Thyroid: No thyromegaly.  Cardiovascular:     Rate and Rhythm: Normal rate and regular rhythm.  Pulmonary:     Effort: No respiratory distress.     Breath sounds: Normal breath sounds. No wheezing.  Abdominal:     General: Bowel sounds are normal.     Palpations: Abdomen is soft.     Tenderness: There is no abdominal tenderness.  Genitourinary:    Comments: Normal external genitalia.  Vaginal vault without lesions.  Cervix identified.  Pap smear performed.  Could not appreciate any adnexal masses or tenderness.   Musculoskeletal:        General: No swelling or tenderness.     Cervical back: Neck supple. No tenderness.  Lymphadenopathy:     Cervical: No cervical adenopathy.  Skin:    Findings: No erythema or rash.  Neurological:     Mental Status: She is alert.  Psychiatric:        Mood and Affect: Mood normal.        Behavior: Behavior normal.     Outpatient Encounter Medications as of 11/29/2020  Medication Sig   fluticasone (FLONASE) 50 MCG/ACT nasal spray Place 2 sprays into both nostrils daily.   meloxicam (MOBIC) 7.5 MG tablet Take 1 tablet (7.5 mg total) by mouth daily.   No facility-administered encounter medications on file as of 11/29/2020.     Lab Results  Component Value Date   WBC 5.8 11/25/2020   HGB 13.7 11/25/2020   HCT 40.4 11/25/2020   PLT 213.0 11/25/2020   GLUCOSE 113 (H) 11/25/2020   CHOL 174 11/25/2020   TRIG 49.0 11/25/2020   HDL 66.60 11/25/2020   LDLCALC 98 11/25/2020   ALT 12 11/25/2020   AST 17 11/25/2020   NA 140 11/25/2020   K 4.1 11/25/2020   CL 102 11/25/2020   CREATININE 0.79 11/25/2020   BUN 14 11/25/2020   CO2 32 11/25/2020   TSH 1.47 11/25/2020       Assessment & Plan:   Problem List Items Addressed This Visit     Health care maintenance    Physical today 11/29/20.  Mammogram 11/12/19 - Birads II.  Schedule f/u mammogram.  Discussed due  colonoscopy.  Refer to GI.       Hot flashes    Period change as outlined.  Some hot flashes.  Follow. Does not feel needs further intervention at  this time.       Iron deficiency    Given period change as outlined.  Will stop iron.  Follow cbc and iron studies.        Left hip pain    Hip is better.        Nocturia    Discussed possible sleep apnea.  Desires to follow.        Other Visit Diagnoses     Routine general medical examination at a health care facility    -  Primary   Colon cancer screening       Relevant Orders   Ambulatory referral to Gastroenterology   Encounter for screening mammogram for malignant neoplasm of breast       Relevant Orders   MM DIAG BREAST TOMO BILATERAL        Dale Grindstone, MD

## 2020-11-29 NOTE — Patient Instructions (Signed)
Stop iron

## 2020-12-02 ENCOUNTER — Encounter: Payer: Self-pay | Admitting: Internal Medicine

## 2020-12-03 NOTE — Telephone Encounter (Signed)
Called patient. Was seen this week. Patient has f/u scheduled in 4 weeks for BP. Advised patient to continue to monitor pressures, record them and bring to appt. Also advised when she checks pressures to sit and relax prior to checking and not to check repeatedly because this can cause BP to be elevated. If acute sx- persistent headache, blurred vision, dizziness, etc she should be evaluated more acutely. Patient agreed. Confirmed patient doing ok.

## 2020-12-04 ENCOUNTER — Encounter: Payer: Self-pay | Admitting: Internal Medicine

## 2020-12-04 DIAGNOSIS — R351 Nocturia: Secondary | ICD-10-CM | POA: Insufficient documentation

## 2020-12-04 NOTE — Assessment & Plan Note (Signed)
Discussed possible sleep apnea.  Desires to follow.

## 2020-12-04 NOTE — Assessment & Plan Note (Signed)
Hip is better.  

## 2020-12-04 NOTE — Assessment & Plan Note (Addendum)
Given period change as outlined.  Will stop iron.  Follow cbc and iron studies.

## 2020-12-04 NOTE — Assessment & Plan Note (Signed)
Period change as outlined.  Some hot flashes.  Follow. Does not feel needs further intervention at this time.

## 2020-12-28 ENCOUNTER — Encounter: Payer: BC Managed Care – PPO | Admitting: Internal Medicine

## 2021-01-03 ENCOUNTER — Ambulatory Visit: Payer: BC Managed Care – PPO | Admitting: Internal Medicine

## 2021-01-03 ENCOUNTER — Other Ambulatory Visit: Payer: Self-pay

## 2021-01-03 ENCOUNTER — Encounter: Payer: Self-pay | Admitting: Internal Medicine

## 2021-01-03 VITALS — BP 122/90 | HR 68 | Temp 99.1°F | Ht 66.0 in | Wt 147.0 lb

## 2021-01-03 DIAGNOSIS — I10 Essential (primary) hypertension: Secondary | ICD-10-CM

## 2021-01-03 DIAGNOSIS — E611 Iron deficiency: Secondary | ICD-10-CM

## 2021-01-03 DIAGNOSIS — R519 Headache, unspecified: Secondary | ICD-10-CM

## 2021-01-03 MED ORDER — TELMISARTAN 20 MG PO TABS: 20.0000 mg | ORAL_TABLET | Freq: Every day | ORAL | 1 refills | Status: DC

## 2021-01-03 NOTE — Progress Notes (Signed)
Patient ID: Amanda Rojas, female   DOB: 1970/04/13, 50 y.o.   MRN: [id number]   Subjective:    Patient ID: Amanda Rojas, female    DOB: 1970/09/22, 50 y.o.   MRN: [id number]  This visit occurred during the SARS-CoV-2 public health emergency.  Safety protocols were in place, including screening questions prior to the visit, additional usage of staff PPE, and extensive cleaning of exam room while observing appropriate contact time as indicated for disinfecting solutions.   Patient here for a scheduled follow up.   Chief Complaint  Patient presents with   Follow-up    BP   .   HPI She has been monitoring her blood pressure.  Outside checks mostly averaging 130-150/90s.  Has a history of migraine headaches.  Has one approximately q month (on average).  Relieved with excedrin migraine x 1.  Has noticed some intermittent headaches - dull - different from her migraines.  Not severe.  No dizziness or light headedness.  Sees ophthalmology regularly.  They are following her pressures.  States has been ok recently.  Stays active.  Has not been exercising as much lately, but plans to restart.  Off iron.  No chest pain or sob reported.  No abdominal pain.  Light spotting 09/2020.  No period since.     Past Medical History:  Diagnosis Date   History of HPV infection    abnormal pap s/p cryosurgery   Hx of migraine headaches    menstrual migraines   Kidney stones    Past Surgical History:  Procedure Laterality Date   WRIST FRACTURE SURGERY  1996   metal plate placed   Family History  Problem Relation Age of Onset   Arthritis Mother    Thyroid disease Mother    Heart disease Maternal Grandmother    Breast cancer Cousin 36       subsequently developed acute leukemia   Prostate cancer Neg Hx    Bladder Cancer Neg Hx    Social History   Socioeconomic History   Marital status: Married    Spouse name: Not on file   Number of children: 3   Years of education: Not on file   Highest  education level: Not on file  Occupational History    Employer: [employer]  Tobacco Use   Smoking status: Never   Smokeless tobacco: Never  Substance and Sexual Activity   Alcohol use: Yes    Alcohol/week: 0.0 standard drinks   Drug use: No   Sexual activity: Not on file  Other Topics Concern   Not on file  Social History Narrative   Not on file   Social Determinants of Health   Financial Resource Strain: Not on file  Food Insecurity: Not on file  Transportation Needs: Not on file  Physical Activity: Not on file  Stress: Not on file  Social Connections: Not on file     Review of Systems  Constitutional:  Negative for appetite change and unexpected weight change.  HENT:  Negative for congestion and sinus pressure.   Respiratory:  Negative for cough, chest tightness and shortness of breath.   Cardiovascular:  Negative for chest pain, palpitations and leg swelling.  Gastrointestinal:  Negative for abdominal pain, diarrhea, nausea and vomiting.  Genitourinary:  Negative for difficulty urinating and dysuria.  Musculoskeletal:  Negative for joint swelling and myalgias.  Skin:  Negative for color change and rash.  Neurological:  Negative for dizziness and light-headedness.  Headaches as outlined.   Psychiatric/Behavioral:  Negative for agitation and dysphoric mood.       Objective:     BP 122/90   Pulse 68   Temp 99.1 F (37.3 C) (Oral)   Ht '5\' 6"'  (1.676 m)   Wt 147 lb (66.7 kg)   LMP  (Exact Date)   SpO2 99%   BMI 23.73 kg/m  Wt Readings from Last 3 Encounters:  01/03/21 147 lb (66.7 kg)  11/29/20 143 lb 9.6 oz (65.1 kg)  09/10/20 141 lb 12.8 oz (64.3 kg)    Physical Exam Vitals reviewed.  Constitutional:      General: She is not in acute distress.    Appearance: Normal appearance.  HENT:     Head: Normocephalic and atraumatic.     Right Ear: External ear normal.     Left Ear: External ear normal.  Eyes:     General: No scleral icterus.        Right eye: No discharge.        Left eye: No discharge.     Conjunctiva/sclera: Conjunctivae normal.  Neck:     Thyroid: No thyromegaly.  Cardiovascular:     Rate and Rhythm: Normal rate and regular rhythm.  Pulmonary:     Effort: No respiratory distress.     Breath sounds: Normal breath sounds. No wheezing.  Abdominal:     General: Bowel sounds are normal.     Palpations: Abdomen is soft.     Tenderness: There is no abdominal tenderness.  Musculoskeletal:        General: No swelling or tenderness.     Cervical back: Neck supple. No tenderness.  Lymphadenopathy:     Cervical: No cervical adenopathy.  Skin:    Findings: No erythema or rash.  Neurological:     Mental Status: She is alert.  Psychiatric:        Mood and Affect: Mood normal.        Behavior: Behavior normal.     Outpatient Encounter Medications as of 01/03/2021  Medication Sig   telmisartan (MICARDIS) 20 MG tablet Take 1 tablet (20 mg total) by mouth daily.   fluticasone (FLONASE) 50 MCG/ACT nasal spray Place 2 sprays into both nostrils daily.   [DISCONTINUED] meloxicam (MOBIC) 7.5 MG tablet Take 1 tablet (7.5 mg total) by mouth daily. (Patient not taking: Reported on 01/03/2021)   No facility-administered encounter medications on file as of 01/03/2021.     Lab Results  Component Value Date   WBC 5.8 11/25/2020   HGB 13.7 11/25/2020   HCT 40.4 11/25/2020   PLT 213.0 11/25/2020   GLUCOSE 113 (H) 11/25/2020   CHOL 174 11/25/2020   TRIG 49.0 11/25/2020   HDL 66.60 11/25/2020   LDLCALC 98 11/25/2020   ALT 12 11/25/2020   AST 17 11/25/2020   NA 140 11/25/2020   K 4.1 11/25/2020   CL 102 11/25/2020   CREATININE 0.79 11/25/2020   BUN 14 11/25/2020   CO2 32 11/25/2020   TSH 1.47 11/25/2020       Assessment & Plan:   Problem List Items Addressed This Visit     Headache    Has a history of migraine headaches.  These appear to be stable.  Does describe headaches as outlined. Not severe.  Treat blood  pressure.  Follow.  Notify me if persistent.       Hypertension, essential    Blood pressure elevated.  Start micardis 21m q day.  Check  met b in the next 10-14 days.  Follow pressures.  Get her back in soon to reassess.       Relevant Medications   telmisartan (MICARDIS) 20 MG tablet   Other Relevant Orders   Basic metabolic panel   Iron deficiency - Primary    Off iron. Recheck cbc and iron studies.       Relevant Orders   CBC with Differential/Platelet   IBC + Ferritin     Einar Pheasant, MD

## 2021-01-04 ENCOUNTER — Encounter: Payer: Self-pay | Admitting: Internal Medicine

## 2021-01-04 NOTE — Assessment & Plan Note (Signed)
Blood pressure elevated.  Start micardis 59m q day.  Check met b in the next 10-14 days.  Follow pressures.  Get her back in soon to reassess.

## 2021-01-04 NOTE — Assessment & Plan Note (Signed)
Has a history of migraine headaches.  These appear to be stable.  Does describe headaches as outlined. Not severe.  Treat blood pressure.  Follow.  Notify me if persistent.

## 2021-01-04 NOTE — Assessment & Plan Note (Signed)
Off iron. Recheck cbc and iron studies.

## 2021-01-16 ENCOUNTER — Other Ambulatory Visit: Payer: Self-pay

## 2021-01-18 ENCOUNTER — Encounter: Admission: RE | Payer: Self-pay | Source: Home / Self Care

## 2021-01-18 ENCOUNTER — Other Ambulatory Visit: Payer: BC Managed Care – PPO

## 2021-01-18 ENCOUNTER — Ambulatory Visit
Admission: RE | Admit: 2021-01-18 | Payer: BC Managed Care – PPO | Source: Home / Self Care | Admitting: Gastroenterology

## 2021-01-18 SURGERY — COLONOSCOPY
Anesthesia: General

## 2021-01-19 ENCOUNTER — Other Ambulatory Visit: Payer: Self-pay

## 2021-01-19 ENCOUNTER — Other Ambulatory Visit (INDEPENDENT_AMBULATORY_CARE_PROVIDER_SITE_OTHER): Payer: BC Managed Care – PPO

## 2021-01-19 DIAGNOSIS — I10 Essential (primary) hypertension: Secondary | ICD-10-CM | POA: Diagnosis not present

## 2021-01-19 DIAGNOSIS — E611 Iron deficiency: Secondary | ICD-10-CM | POA: Diagnosis not present

## 2021-01-19 LAB — CBC WITH DIFFERENTIAL/PLATELET
Basophils Absolute: 0 10*3/uL (ref 0.0–0.1)
Basophils Relative: 0.4 % (ref 0.0–3.0)
Eosinophils Absolute: 0.1 10*3/uL (ref 0.0–0.7)
Eosinophils Relative: 1.9 % (ref 0.0–5.0)
HCT: 38.4 % (ref 36.0–46.0)
Hemoglobin: 12.8 g/dL (ref 12.0–15.0)
Lymphocytes Relative: 31.8 % (ref 12.0–46.0)
Lymphs Abs: 1.8 10*3/uL (ref 0.7–4.0)
MCHC: 33.3 g/dL (ref 30.0–36.0)
MCV: 86.1 fl (ref 78.0–100.0)
Monocytes Absolute: 0.5 10*3/uL (ref 0.1–1.0)
Monocytes Relative: 8.9 % (ref 3.0–12.0)
Neutro Abs: 3.3 10*3/uL (ref 1.4–7.7)
Neutrophils Relative %: 57 % (ref 43.0–77.0)
Platelets: 217 10*3/uL (ref 150.0–400.0)
RBC: 4.45 Mil/uL (ref 3.87–5.11)
RDW: 13.6 % (ref 11.5–15.5)
WBC: 5.8 10*3/uL (ref 4.0–10.5)

## 2021-01-19 LAB — BASIC METABOLIC PANEL
BUN: 11 mg/dL (ref 6–23)
CO2: 30 mEq/L (ref 19–32)
Calcium: 9.2 mg/dL (ref 8.4–10.5)
Chloride: 105 mEq/L (ref 96–112)
Creatinine, Ser: 0.73 mg/dL (ref 0.40–1.20)
GFR: 94.92 mL/min (ref >60.00)
Glucose, Bld: 98 mg/dL (ref 70–99)
Potassium: 4 mEq/L (ref 3.5–5.1)
Sodium: 139 mEq/L (ref 135–145)

## 2021-01-19 LAB — IBC + FERRITIN
Ferritin: 61.6 ng/mL (ref 10.0–291.0)
Iron: 79 ug/dL (ref 42–145)
Saturation Ratios: 24 % (ref 20.0–50.0)
TIBC: 329 ug/dL (ref 250.0–450.0)
Transferrin: 235 mg/dL (ref 212.0–360.0)

## 2021-02-03 ENCOUNTER — Encounter: Payer: Self-pay | Admitting: Internal Medicine

## 2021-02-03 ENCOUNTER — Other Ambulatory Visit: Payer: Self-pay

## 2021-02-03 ENCOUNTER — Ambulatory Visit: Payer: BC Managed Care – PPO | Admitting: Internal Medicine

## 2021-02-03 DIAGNOSIS — I10 Essential (primary) hypertension: Secondary | ICD-10-CM

## 2021-02-03 DIAGNOSIS — R519 Headache, unspecified: Secondary | ICD-10-CM

## 2021-02-03 MED ORDER — TELMISARTAN 40 MG PO TABS: 40.0000 mg | ORAL_TABLET | Freq: Every day | ORAL | 3 refills | Status: DC

## 2021-02-03 NOTE — Progress Notes (Signed)
Patient ID: Amanda Rojas, female   DOB: 29-Mar-1970, 50 y.o.   MRN: [id number]   Subjective:    Patient ID: Amanda Rojas, female    DOB: 1970/03/02, 50 y.o.   MRN: [id number]  This visit occurred during the SARS-CoV-2 public health emergency.  Safety protocols were in place, including screening questions prior to the visit, additional usage of staff PPE, and extensive cleaning of exam room while observing appropriate contact time as indicated for disinfecting solutions.   Patient here for a scheduled follow up.   Chief Complaint  Patient presents with   Hypertension   .   HPI Last visit, persistent elevated blood pressure.  Started on micardis.  Blood pressures 130s/70-80s.  Stays active.  No chest pain or sob reported.  No abdominal pain or bowel change reported.  No abdominal pain.  Handling stress.     Past Medical History:  Diagnosis Date   History of HPV infection    abnormal pap s/p cryosurgery   Hx of migraine headaches    menstrual migraines   Kidney stones    Past Surgical History:  Procedure Laterality Date   WRIST FRACTURE SURGERY  1996   metal plate placed   Family History  Problem Relation Age of Onset   Arthritis Mother    Thyroid disease Mother    Heart disease Maternal Grandmother    Breast cancer Cousin 32       subsequently developed acute leukemia   Prostate cancer Neg Hx    Bladder Cancer Neg Hx    Social History   Socioeconomic History   Marital status: Married    Spouse name: Not on file   Number of children: 3   Years of education: Not on file   Highest education level: Not on file  Occupational History    Employer: [employer]  Tobacco Use   Smoking status: Never   Smokeless tobacco: Never  Substance and Sexual Activity   Alcohol use: Yes    Alcohol/week: 0.0 standard drinks   Drug use: No   Sexual activity: Not on file  Other Topics Concern   Not on file  Social History Narrative   Not on file   Social Determinants of  Health   Financial Resource Strain: Not on file  Food Insecurity: Not on file  Transportation Needs: Not on file  Physical Activity: Not on file  Stress: Not on file  Social Connections: Not on file     Review of Systems  Constitutional:  Negative for appetite change and unexpected weight change.  HENT:  Negative for congestion and sinus pressure.   Respiratory:  Negative for cough, chest tightness and shortness of breath.   Cardiovascular:  Negative for chest pain, palpitations and leg swelling.  Gastrointestinal:  Negative for abdominal pain, diarrhea, nausea and vomiting.  Genitourinary:  Negative for difficulty urinating and dysuria.  Musculoskeletal:  Negative for joint swelling and myalgias.  Skin:  Negative for color change and rash.  Neurological:  Negative for dizziness, light-headedness and headaches.  Psychiatric/Behavioral:  Negative for agitation and dysphoric mood.       Objective:     BP 114/70    Pulse 77    Temp 97.8 F (36.6 C)    Resp 16    Ht 5\' 6"  (1.676 m)    Wt 149 lb (67.6 kg)    SpO2 98%    BMI 24.05 kg/m  Wt Readings from Last 3 Encounters:  02/03/21 149 lb (67.6  kg)  01/03/21 147 lb (66.7 kg)  11/29/20 143 lb 9.6 oz (65.1 kg)    Physical Exam Vitals reviewed.  Constitutional:      General: She is not in acute distress.    Appearance: Normal appearance.  HENT:     Head: Normocephalic and atraumatic.     Right Ear: External ear normal.     Left Ear: External ear normal.  Eyes:     General: No scleral icterus.       Right eye: No discharge.        Left eye: No discharge.     Conjunctiva/sclera: Conjunctivae normal.  Neck:     Thyroid: No thyromegaly.  Cardiovascular:     Rate and Rhythm: Normal rate and regular rhythm.  Pulmonary:     Effort: No respiratory distress.     Breath sounds: Normal breath sounds. No wheezing.  Abdominal:     General: Bowel sounds are normal.     Palpations: Abdomen is soft.     Tenderness: There is no  abdominal tenderness.  Musculoskeletal:        General: No swelling or tenderness.     Cervical back: Neck supple. No tenderness.  Lymphadenopathy:     Cervical: No cervical adenopathy.  Skin:    Findings: No erythema or rash.  Neurological:     Mental Status: She is alert.  Psychiatric:        Mood and Affect: Mood normal.        Behavior: Behavior normal.     Outpatient Encounter Medications as of 02/03/2021  Medication Sig   telmisartan (MICARDIS) 40 MG tablet Take 1 tablet (40 mg total) by mouth daily.   [DISCONTINUED] telmisartan (MICARDIS) 20 MG tablet Take 1 tablet (20 mg total) by mouth daily.   [DISCONTINUED] fluticasone (FLONASE) 50 MCG/ACT nasal spray Place 2 sprays into both nostrils daily.   No facility-administered encounter medications on file as of 02/03/2021.     Lab Results  Component Value Date   WBC 5.8 01/19/2021   HGB 12.8 01/19/2021   HCT 38.4 01/19/2021   PLT 217.0 01/19/2021   GLUCOSE 98 01/19/2021   CHOL 174 11/25/2020   TRIG 49.0 11/25/2020   HDL 66.60 11/25/2020   LDLCALC 98 11/25/2020   ALT 12 11/25/2020   AST 17 11/25/2020   NA 139 01/19/2021   K 4.0 01/19/2021   CL 105 01/19/2021   CREATININE 0.73 01/19/2021   BUN 11 01/19/2021   CO2 30 01/19/2021   TSH 1.47 11/25/2020       Assessment & Plan:   Problem List Items Addressed This Visit     Headache    No significant headaches reported today.  Follow        Hypertension, essential    Blood pressure on recheck 130s/86.  Still elevated above goal.  Increase micardis to 40mg  q day.  Follow pressures.  Follow metabolic panel.        Relevant Medications   telmisartan (MICARDIS) 40 MG tablet     , MD

## 2021-02-06 ENCOUNTER — Encounter: Payer: Self-pay | Admitting: Internal Medicine

## 2021-02-06 NOTE — Assessment & Plan Note (Signed)
Blood pressure on recheck 130s/86.  Still elevated above goal.  Increase micardis to 40mg  q day.  Follow pressures.  Follow metabolic panel.

## 2021-02-06 NOTE — Assessment & Plan Note (Signed)
No significant headaches reported today.  Follow

## 2021-02-24 ENCOUNTER — Other Ambulatory Visit: Payer: Self-pay

## 2021-02-24 ENCOUNTER — Encounter: Payer: Self-pay | Admitting: Emergency Medicine

## 2021-02-24 ENCOUNTER — Ambulatory Visit
Admission: EM | Admit: 2021-02-24 | Discharge: 2021-02-24 | Disposition: A | Discharge status: Home / Self Care | Payer: BC Managed Care – PPO | Attending: Emergency Medicine | Admitting: Emergency Medicine

## 2021-02-24 DIAGNOSIS — R051 Acute cough: Secondary | ICD-10-CM

## 2021-02-24 DIAGNOSIS — B349 Viral infection, unspecified: Secondary | ICD-10-CM

## 2021-02-24 MED ORDER — BENZONATATE 100 MG PO CAPS: 100.0000 mg | ORAL_CAPSULE | Freq: Three times a day (TID) | ORAL | 0 refills | Status: DC | PRN

## 2021-02-24 NOTE — ED Provider Notes (Signed)
Amanda Rojas    CSN: [id number] Arrival date & time: 02/24/21  0825      History   Chief Complaint Chief Complaint  Patient presents with   Cough   Chest Congestion    HPI Amanda Rojas is a 51 y.o. female.  Patient presents with 6-day history of cough.  At the onset of her illness, she had fever, congestion, diarrhea but these symptoms resolved.  The cough has lingered.  No fever, vomiting, diarrhea in the last 24 hours.  She reports sensation of chest congestion but her cough is nonproductive.  Treatment at home with Mucinex DM.  She denies shortness of breath or other symptoms.  Her medical history includes hypertension, migraine headaches, and kidney stones.  The history is provided by the patient and medical records.   Past Medical History:  Diagnosis Date   History of HPV infection    abnormal pap s/p cryosurgery   Hx of migraine headaches    menstrual migraines   Kidney stones     Patient Active Problem List   Diagnosis Date Noted   Hypertension, essential 01/03/2021   Nocturia 12/04/2020   Hot flashes 09/12/2020   Left hip pain 09/10/2020   Headache 01/20/2018   Breast cyst 10/21/2014   Breast nodule 10/19/2014   Health care maintenance 10/19/2014   Fatigue 10/16/2014   Hemorrhoid 06/25/2013   Bladder prolapse, female, acquired 06/25/2013   Abnormal mammogram 05/26/2013   Nephrolithiasis 01/15/2012   Iron deficiency 01/15/2012   Abnormal Pap smear of cervix 01/15/2012    Past Surgical History:  Procedure Laterality Date   WRIST FRACTURE SURGERY  1996   metal plate placed    OB History     Gravida  2   Para  2   Term      Preterm      AB      Living  2      SAB      IAB      Ectopic      Multiple      Live Births           Obstetric Comments  Menstrual age: 49  Age 1st Pregnancy: 72           Home Medications    Prior to Admission medications   Medication Sig Start Date End Date Taking? Authorizing  Provider  benzonatate (TESSALON) 100 MG capsule Take 1 capsule (100 mg total) by mouth 3 (three) times daily as needed for cough. 02/24/21  Yes Mickie Bail, NP  telmisartan (MICARDIS) 40 MG tablet Take 1 tablet (40 mg total) by mouth daily. 02/03/21   Dale , MD    Family History Family History  Problem Relation Age of Onset   Arthritis Mother    Thyroid disease Mother    Heart disease Maternal Grandmother    Breast cancer Cousin 40       subsequently developed acute leukemia   Prostate cancer Neg Hx    Bladder Cancer Neg Hx     Social History Social History   Tobacco Use   Smoking status: Never   Smokeless tobacco: Never  Substance Use Topics   Alcohol use: Yes    Alcohol/week: 0.0 standard drinks   Drug use: No     Allergies   Percocet [oxycodone-acetaminophen]   Review of Systems Review of Systems  HENT:  Positive for congestion. Negative for ear pain and sore throat.   Respiratory:  Positive for cough.  Negative for shortness of breath.   Cardiovascular:  Negative for chest pain and palpitations.  Skin:  Negative for rash.  All other systems reviewed and are negative.   Physical Exam Triage Vital Signs ED Triage Vitals  Enc Vitals Group     BP 02/24/21 0837 109/72     Pulse Rate 02/24/21 0837 72     Resp 02/24/21 0837 18     Temp 02/24/21 0837 98.2 F (36.8 C)     Temp Source 02/24/21 0837 Oral     SpO2 02/24/21 0837 98 %     Weight --      Height --      Head Circumference --      Peak Flow --      Pain Score 02/24/21 0847 0     Pain Loc --      Pain Edu? --      Excl. in GC? --    No data found.  Updated Vital Signs BP 109/72 (BP Location: Left Arm)    Pulse 72    Temp 98.2 F (36.8 C) (Oral)    Resp 18    SpO2 98%   Visual Acuity Right Eye Distance:   Left Eye Distance:   Bilateral Distance:    Right Eye Near:   Left Eye Near:    Bilateral Near:     Physical Exam Vitals and nursing note reviewed.  Constitutional:       General: She is not in acute distress.    Appearance: She is well-developed. She is not ill-appearing.  HENT:     Left Ear: Tympanic membrane normal.     Nose: Nose normal.     Mouth/Throat:     Mouth: Mucous membranes are moist.     Pharynx: Oropharynx is clear.  Cardiovascular:     Rate and Rhythm: Normal rate and regular rhythm.     Heart sounds: Normal heart sounds.  Pulmonary:     Effort: Pulmonary effort is normal. No respiratory distress.     Breath sounds: Normal breath sounds.  Musculoskeletal:     Cervical back: Neck supple.  Skin:    General: Skin is warm and dry.  Neurological:     Mental Status: She is alert.  Psychiatric:        Mood and Affect: Mood normal.        Behavior: Behavior normal.     UC Treatments / Results  Labs (all labs ordered are listed, but only abnormal results are displayed) Labs Reviewed - No data to display  EKG   Radiology No results found.  Procedures Procedures (including critical care time)  Medications Ordered in UC Medications - No data to display  Initial Impression / Assessment and Plan / UC Course  I have reviewed the triage vital signs and the nursing notes.  Pertinent labs & imaging results that were available during my care of the patient were reviewed by me and considered in my medical decision making (see chart for details).    Cough due to resolving viral illness.  Treating with Tessalon Perles.  Cautioned patient to avoid OTC products that may elevate her blood pressure due to her history of hypertension.  Suggested plain Mucinex or Coricidin HBP.  Education provided on cough and viral illness.  Instructed patient to follow-up with her PCP if her symptoms are not improving.  She agrees to plan of care.  Final Clinical Impressions(s) / UC Diagnoses   Final diagnoses:  Acute cough  Viral illness     Discharge Instructions      Take the Tessalon Perles as directed for cough.  Follow up with your primary  care provider if your symptoms are not improving.        ED Prescriptions     Medication Sig Dispense Auth. Provider   benzonatate (TESSALON) 100 MG capsule Take 1 capsule (100 mg total) by mouth 3 (three) times daily as needed for cough. 21 capsule Mickie Bail, NP      PDMP not reviewed this encounter.   Mickie Bail, NP 02/24/21 [phone]

## 2021-02-24 NOTE — ED Triage Notes (Signed)
Pt here with cough after a URI last week that is persistent and non-productive. She's experiencing increased chest congestion and feels like she needs to cough up mucous, but cannot.

## 2021-02-24 NOTE — Discharge Instructions (Addendum)
Take the Tessalon Perles as directed for cough.  Follow up with your primary care provider if your symptoms are not improving.    

## 2021-05-04 ENCOUNTER — Ambulatory Visit: Payer: BC Managed Care – PPO | Admitting: Internal Medicine

## 2021-05-04 ENCOUNTER — Encounter: Payer: Self-pay | Admitting: Internal Medicine

## 2021-05-04 ENCOUNTER — Other Ambulatory Visit: Payer: Self-pay

## 2021-05-04 VITALS — BP 126/74 | HR 68 | Temp 98.0°F | Resp 16 | Ht 65.0 in | Wt 151.6 lb

## 2021-05-04 DIAGNOSIS — Z1231 Encounter for screening mammogram for malignant neoplasm of breast: Secondary | ICD-10-CM

## 2021-05-04 DIAGNOSIS — Z1159 Encounter for screening for other viral diseases: Secondary | ICD-10-CM

## 2021-05-04 DIAGNOSIS — R232 Flushing: Secondary | ICD-10-CM | POA: Diagnosis not present

## 2021-05-04 DIAGNOSIS — M7989 Other specified soft tissue disorders: Secondary | ICD-10-CM

## 2021-05-04 DIAGNOSIS — I10 Essential (primary) hypertension: Secondary | ICD-10-CM | POA: Diagnosis not present

## 2021-05-04 DIAGNOSIS — Z114 Encounter for screening for human immunodeficiency virus [HIV]: Secondary | ICD-10-CM

## 2021-05-04 LAB — COMPREHENSIVE METABOLIC PANEL
ALT: 13 U/L (ref 0–35)
AST: 16 U/L (ref 0–37)
Albumin: 4.3 g/dL (ref 3.5–5.2)
Alkaline Phosphatase: 58 U/L (ref 39–117)
BUN: 18 mg/dL (ref 6–23)
CO2: 31 mEq/L (ref 19–32)
Calcium: 9.3 mg/dL (ref 8.4–10.5)
Chloride: 104 mEq/L (ref 96–112)
Creatinine, Ser: 0.82 mg/dL (ref 0.40–1.20)
GFR: 82.39 mL/min (ref >60.00)
Glucose, Bld: 93 mg/dL (ref 70–99)
Potassium: 4.1 mEq/L (ref 3.5–5.1)
Sodium: 141 mEq/L (ref 135–145)
Total Bilirubin: 0.5 mg/dL (ref 0.2–1.2)
Total Protein: 6.5 g/dL (ref 6.0–8.3)

## 2021-05-04 LAB — T4, FREE: Free T4: 0.74 ng/dL (ref 0.60–1.60)

## 2021-05-04 LAB — TSH: TSH: 2.13 u[IU]/mL (ref 0.35–5.50)

## 2021-05-04 LAB — T3, FREE: T3, Free: 3.6 pg/mL (ref 2.3–4.2)

## 2021-05-04 MED ORDER — VENLAFAXINE HCL ER 37.5 MG PO CP24: 37.5000 mg | ORAL_CAPSULE | Freq: Every day | ORAL | 1 refills | Status: DC

## 2021-05-04 NOTE — Progress Notes (Signed)
Patient ID: Amanda Rojas, female   DOB: 1971-02-05, 51 y.o.   MRN: [id number] ? ? ?Subjective:  ? ? Patient ID: Amanda Rojas, female    DOB: [birth year], 51 y.o.   MRN: [id number] ? ?This visit occurred during the SARS-CoV-2 public health emergency.  Safety protocols were in place, including screening questions prior to the visit, additional usage of staff PPE, and extensive cleaning of exam room while observing appropriate contact time as indicated for disinfecting solutions.  ? ?Patient here for work in appt.  ? ?Chief Complaint  ?Patient presents with  ? lump on back  ? .  ? ?HPI ?Work in app:  knot/lump - left side - posterior rib.  States has probably been present for one year.  No pain.  Does not appear to be getting larger.  No redness.  Also reports problems with hot flashes, sleep - menopausal issues.  Feels needs something to help level things out.  No chest pain or sob reported.  No abdominal pain reported.  No bowel change.  Blood pressure averaging 120s/70s.   ? ?Past Medical History:  ?Diagnosis Date  ? History of HPV infection   ? abnormal pap s/p cryosurgery  ? Hx of migraine headaches   ? menstrual migraines  ? Kidney stones   ? ?Past Surgical History:  ?Procedure Laterality Date  ? WRIST FRACTURE SURGERY  1996  ? metal plate placed  ? ?Family History  ?Problem Relation Age of Onset  ? Arthritis Mother   ? Thyroid disease Mother   ? Heart disease Maternal Grandmother   ? Breast cancer Cousin 8  ?     subsequently developed acute leukemia  ? Prostate cancer Neg Hx   ? Bladder Cancer Neg Hx   ? ?Social History  ? ?Socioeconomic History  ? Marital status: Married  ?  Spouse name: Not on file  ? Number of children: 3  ? Years of education: Not on file  ? Highest education level: Not on file  ?Occupational History  ?  Employer: [employer]  ?Tobacco Use  ? Smoking status: Never  ? Smokeless tobacco: Never  ?Substance and Sexual Activity  ? Alcohol use: Yes  ?  Alcohol/week: 0.0 standard drinks   ? Drug use: No  ? Sexual activity: Not on file  ?Other Topics Concern  ? Not on file  ?Social History Narrative  ? Not on file  ? ?Social Determinants of Health  ? ?Financial Resource Strain: Not on file  ?Food Insecurity: Not on file  ?Transportation Needs: Not on file  ?Physical Activity: Not on file  ?Stress: Not on file  ?Social Connections: Not on file  ? ? ? ?Review of Systems  ?Constitutional:  Negative for appetite change and unexpected weight change.  ?HENT:  Negative for congestion and sinus pressure.   ?Respiratory:  Negative for cough, chest tightness and shortness of breath.   ?Cardiovascular:  Negative for chest pain, palpitations and leg swelling.  ?Gastrointestinal:  Negative for abdominal pain, diarrhea, nausea and vomiting.  ?Genitourinary:  Negative for difficulty urinating and dysuria.  ?Musculoskeletal:  Negative for joint swelling and myalgias.  ?Skin:  Negative for color change and rash.  ?Neurological:  Negative for dizziness, light-headedness and headaches.  ?Psychiatric/Behavioral:  Negative for agitation and dysphoric mood.   ? ?   ?Objective:  ?  ? ?BP 126/74   Pulse 68   Temp 98 ?F (36.7 ?C)   Resp 16   Ht 5\' 5"  (1.651  m)   Wt 151 lb 9.6 oz (68.8 kg)   SpO2 98%   BMI 25.23 kg/m?  ?Wt Readings from Last 3 Encounters:  ?05/04/21 151 lb 9.6 oz (68.8 kg)  ?02/03/21 149 lb (67.6 kg)  ?01/03/21 147 lb (66.7 kg)  ? ? ?Physical Exam ?Vitals reviewed.  ?Constitutional:   ?   General: She is not in acute distress. ?   Appearance: Normal appearance.  ?HENT:  ?   Head: Normocephalic and atraumatic.  ?   Right Ear: External ear normal.  ?   Left Ear: External ear normal.  ?Eyes:  ?   General: No scleral icterus.    ?   Right eye: No discharge.     ?   Left eye: No discharge.  ?   Conjunctiva/sclera: Conjunctivae normal.  ?Neck:  ?   Thyroid: No thyromegaly.  ?Cardiovascular:  ?   Rate and Rhythm: Normal rate and regular rhythm.  ?Pulmonary:  ?   Effort: No respiratory distress.  ?   Breath  sounds: Normal breath sounds. No wheezing.  ?Abdominal:  ?   General: Bowel sounds are normal.  ?   Palpations: Abdomen is soft.  ?   Tenderness: There is no abdominal tenderness.  ?Musculoskeletal:     ?   General: No swelling or tenderness.  ?   Cervical back: Neck supple. No tenderness.  ?Lymphadenopathy:  ?   Cervical: No cervical adenopathy.  ?Skin: ?   Findings: No erythema or rash.  ?   Comments: Increase soft tissue mass - left posterior back - appears to be c/w lipoma.   ?Neurological:  ?   Mental Status: She is alert.  ?Psychiatric:     ?   Mood and Affect: Mood normal.     ?   Behavior: Behavior normal.  ? ? ? ?Outpatient Encounter Medications as of 05/04/2021  ?Medication Sig  ? venlafaxine XR (EFFEXOR XR) 37.5 MG 24 hr capsule Take 1 capsule (37.5 mg total) by mouth daily.  ? benzonatate (TESSALON) 100 MG capsule Take 1 capsule (100 mg total) by mouth 3 (three) times daily as needed for cough.  ? telmisartan (MICARDIS) 40 MG tablet Take 1 tablet (40 mg total) by mouth daily.  ? ?No facility-administered encounter medications on file as of 05/04/2021.  ?  ? ?Lab Results  ?Component Value Date  ? WBC 5.8 01/19/2021  ? HGB 12.8 01/19/2021  ? HCT 38.4 01/19/2021  ? PLT 217.0 01/19/2021  ? GLUCOSE 93 05/04/2021  ? CHOL 174 11/25/2020  ? TRIG 49.0 11/25/2020  ? HDL 66.60 11/25/2020  ? [place] 98 11/25/2020  ? ALT 13 05/04/2021  ? AST 16 05/04/2021  ? NA 141 05/04/2021  ? K 4.1 05/04/2021  ? CL 104 05/04/2021  ? CREATININE 0.82 05/04/2021  ? BUN 18 05/04/2021  ? CO2 31 05/04/2021  ? TSH 2.13 05/04/2021  ? ? ?   ?Assessment & Plan:  ? ?Problem List Items Addressed This Visit   ? ? Breast cancer screening  ?  Overdue mammogram.  Need to schedule.  ?  ?  ? Hot flashes  ?  Reports hot flashes, sleeping issues, etc.  Discussed.  Feels needs something to help with current symptoms.  Discussed treatment options.  Wants to try effexor. Will start low dose - 37.5mg  q day.  Will need to monitor blood pressure.  Follow.   Get her back in soon to reassess.   ?  ?  ? Relevant Orders  ?  T4, free (Completed)  ? TSH (Completed)  ? T3, free (Completed)  ? Hypertension, essential - Primary  ?  Blood pressure as outlined.  Continue micardis.  Follow pressures.  Follow metabolic panel.  ?  ?  ? Relevant Orders  ? Comprehensive metabolic panel (Completed)  ? Soft tissue mass  ?  Soft tissue mass - left posterior back.  Appears to be c/w lipoma.  She desires surgery opinion.  Refer to surgery for further evaluation.  ?  ?  ? Relevant Orders  ? Ambulatory referral to General Surgery  ? ?Other Visit Diagnoses   ? ? Screening for HIV without presence of risk factors      ? Relevant Orders  ? HIV Antibody (routine testing w rflx)  ? Encounter for hepatitis C screening test for low risk patient      ? Relevant Orders  ? Hepatitis C antibody  ? ?  ? ? ? ?Einar Pheasant, MD  ?

## 2021-05-05 ENCOUNTER — Encounter: Payer: Self-pay | Admitting: Internal Medicine

## 2021-05-05 LAB — HIV ANTIBODY (ROUTINE TESTING W REFLEX): HIV 1&2 Ab, 4th Generation: NONREACTIVE

## 2021-05-05 LAB — HEPATITIS C ANTIBODY
Hepatitis C Ab: NONREACTIVE
SIGNAL TO CUT-OFF: 0.03 (ref <1.00)

## 2021-05-05 NOTE — Assessment & Plan Note (Signed)
Blood pressure as outlined.  Continue micardis.  Follow pressures.  Follow metabolic panel.  ?

## 2021-05-05 NOTE — Assessment & Plan Note (Signed)
Soft tissue mass - left posterior back.  Appears to be c/w lipoma.  She desires surgery opinion.  Refer to surgery for further evaluation.  ?

## 2021-05-05 NOTE — Assessment & Plan Note (Signed)
Overdue mammogram.  Need to schedule.   

## 2021-05-05 NOTE — Assessment & Plan Note (Signed)
Reports hot flashes, sleeping issues, etc.  Discussed.  Feels needs something to help with current symptoms.  Discussed treatment options.  Wants to try effexor. Will start low dose - 37.5mg  q day.  Will need to monitor blood pressure.  Follow.  Get her back in soon to reassess.   ?

## 2021-05-17 ENCOUNTER — Ambulatory Visit: Payer: BC Managed Care – PPO | Admitting: Internal Medicine

## 2021-05-17 ENCOUNTER — Encounter: Payer: Self-pay | Admitting: Internal Medicine

## 2021-05-17 ENCOUNTER — Other Ambulatory Visit: Payer: Self-pay

## 2021-05-17 DIAGNOSIS — Z1231 Encounter for screening mammogram for malignant neoplasm of breast: Secondary | ICD-10-CM | POA: Diagnosis not present

## 2021-05-17 DIAGNOSIS — I10 Essential (primary) hypertension: Secondary | ICD-10-CM | POA: Diagnosis not present

## 2021-05-17 DIAGNOSIS — R232 Flushing: Secondary | ICD-10-CM

## 2021-05-17 NOTE — Progress Notes (Signed)
Patient ID: Amanda Rojas, female   DOB: 07/19/1970, 51 y.o.   MRN: [id number] ? ? ?Subjective:  ? ? Patient ID: Amanda Rojas, female    DOB: October 18, 1970, 51 y.o.   MRN: [id number] ? ?This visit occurred during the SARS-CoV-2 public health emergency.  Safety protocols were in place, including screening questions prior to the visit, additional usage of staff PPE, and extensive cleaning of exam room while observing appropriate contact time as indicated for disinfecting solutions.  ? ?Patient here for a scheduled follow up.  ?Chief Complaint  ?Patient presents with  ? Follow-up  ?  2wk f/u - pt was started on effexor. Has not taken it. Read side effects and decided not to take it. Has been taking Magnesium and Vitamin E.   ? .  ? ?HPI ?Was recently prescribed effexor to help with hot flashes, sleep issues, etc.  Read side effects and decided not to take the medication.  Is taking magnesium and vitamin E.  Taking micardis and tolerating.  Blood pressure checks:  120s/70-80s.  No chest pain.  Breathing stable.  No nausea or vomiting.   ? ? ?Past Medical History:  ?Diagnosis Date  ? History of HPV infection   ? abnormal pap s/p cryosurgery  ? Hx of migraine headaches   ? menstrual migraines  ? Kidney stones   ? ?Past Surgical History:  ?Procedure Laterality Date  ? WRIST FRACTURE SURGERY  1996  ? metal plate placed  ? ?Family History  ?Problem Relation Age of Onset  ? Arthritis Mother   ? Thyroid disease Mother   ? Heart disease Maternal Grandmother   ? Breast cancer Cousin 37  ?     subsequently developed acute leukemia  ? Prostate cancer Neg Hx   ? Bladder Cancer Neg Hx   ? ?Social History  ? ?Socioeconomic History  ? Marital status: Married  ?  Spouse name: Not on file  ? Number of children: 3  ? Years of education: Not on file  ? Highest education level: Not on file  ?Occupational History  ?  Employer: [employer]  ?Tobacco Use  ? Smoking status: Never  ? Smokeless tobacco: Never  ?Substance and Sexual  Activity  ? Alcohol use: Yes  ?  Alcohol/week: 0.0 standard drinks  ? Drug use: No  ? Sexual activity: Not on file  ?Other Topics Concern  ? Not on file  ?Social History Narrative  ? Not on file  ? ?Social Determinants of Health  ? ?Financial Resource Strain: Not on file  ?Food Insecurity: Not on file  ?Transportation Needs: Not on file  ?Physical Activity: Not on file  ?Stress: Not on file  ?Social Connections: Not on file  ? ? ? ?Review of Systems  ?Constitutional:  Negative for appetite change and unexpected weight change.  ?HENT:  Negative for congestion and sinus pressure.   ?Respiratory:  Negative for cough, chest tightness and shortness of breath.   ?Cardiovascular:  Negative for chest pain, palpitations and leg swelling.  ?Gastrointestinal:  Negative for abdominal pain, diarrhea, nausea and vomiting.  ?Genitourinary:  Negative for difficulty urinating and dysuria.  ?Musculoskeletal:  Negative for joint swelling and myalgias.  ?Skin:  Negative for color change and rash.  ?Neurological:  Negative for dizziness, light-headedness and headaches.  ?Psychiatric/Behavioral:  Negative for agitation and dysphoric mood.   ? ?   ?Objective:  ?  ? ?BP 126/76 (BP Location: Left Arm, Patient Position: Sitting, Cuff Size: Small)  Pulse 68   Temp 98.2 ?F (36.8 ?C) (Oral)   Ht 5\' 5"  (1.651 m)   Wt 151 lb (68.5 kg)   SpO2 98%   BMI 25.13 kg/m?  ?Wt Readings from Last 3 Encounters:  ?05/17/21 151 lb (68.5 kg)  ?05/04/21 151 lb 9.6 oz (68.8 kg)  ?02/03/21 149 lb (67.6 kg)  ? ? ?Physical Exam ?Vitals reviewed.  ?Constitutional:   ?   General: She is not in acute distress. ?   Appearance: Normal appearance.  ?HENT:  ?   Head: Normocephalic and atraumatic.  ?   Right Ear: External ear normal.  ?   Left Ear: External ear normal.  ?Eyes:  ?   General: No scleral icterus.    ?   Right eye: No discharge.     ?   Left eye: No discharge.  ?   Conjunctiva/sclera: Conjunctivae normal.  ?Neck:  ?   Thyroid: No thyromegaly.   ?Cardiovascular:  ?   Rate and Rhythm: Normal rate and regular rhythm.  ?Pulmonary:  ?   Effort: No respiratory distress.  ?   Breath sounds: Normal breath sounds. No wheezing.  ?Abdominal:  ?   General: Bowel sounds are normal.  ?   Palpations: Abdomen is soft.  ?   Tenderness: There is no abdominal tenderness.  ?Musculoskeletal:     ?   General: No swelling or tenderness.  ?   Cervical back: Neck supple. No tenderness.  ?Lymphadenopathy:  ?   Cervical: No cervical adenopathy.  ?Skin: ?   Findings: No erythema or rash.  ?Neurological:  ?   Mental Status: She is alert.  ?Psychiatric:     ?   Mood and Affect: Mood normal.     ?   Behavior: Behavior normal.  ? ? ? ?Outpatient Encounter Medications as of 05/17/2021  ?Medication Sig  ? telmisartan (MICARDIS) 40 MG tablet Take 1 tablet (40 mg total) by mouth daily.  ? [DISCONTINUED] benzonatate (TESSALON) 100 MG capsule Take 1 capsule (100 mg total) by mouth 3 (three) times daily as needed for cough. (Patient not taking: Reported on 05/17/2021)  ? [DISCONTINUED] venlafaxine XR (EFFEXOR XR) 37.5 MG 24 hr capsule Take 1 capsule (37.5 mg total) by mouth daily. (Patient not taking: Reported on 05/17/2021)  ? ?No facility-administered encounter medications on file as of 05/17/2021.  ?  ? ?Lab Results  ?Component Value Date  ? WBC 5.8 01/19/2021  ? HGB 12.8 01/19/2021  ? HCT 38.4 01/19/2021  ? PLT 217.0 01/19/2021  ? GLUCOSE 93 05/04/2021  ? CHOL 174 11/25/2020  ? TRIG 49.0 11/25/2020  ? HDL 66.60 11/25/2020  ? [place] 98 11/25/2020  ? ALT 13 05/04/2021  ? AST 16 05/04/2021  ? NA 141 05/04/2021  ? K 4.1 05/04/2021  ? CL 104 05/04/2021  ? CREATININE 0.82 05/04/2021  ? BUN 18 05/04/2021  ? CO2 31 05/04/2021  ? TSH 2.13 05/04/2021  ? ? ?   ?Assessment & Plan:  ? ?Problem List Items Addressed This Visit   ? ? Breast cancer screening  ?  Overdue mammogram.  Need to schedule.  Order placed. ?  ?  ? Relevant Orders  ? MM 3D SCREEN BREAST BILATERAL  ? Hot flashes  ?  Elected not to take  effexor.  She is taking magnesium and vitamin E.  Follow.  ?  ?  ? Hypertension, essential  ?  Tolerating micardis.  Continue.  Follow pressures.  Follow metabolic panel.  ?  ?  ? ? ? ?  Einar Pheasant, MD  ?

## 2021-05-23 ENCOUNTER — Telehealth: Payer: Self-pay | Admitting: Internal Medicine

## 2021-05-23 ENCOUNTER — Encounter: Payer: Self-pay | Admitting: Internal Medicine

## 2021-05-23 DIAGNOSIS — Z1231 Encounter for screening mammogram for malignant neoplasm of breast: Secondary | ICD-10-CM

## 2021-05-23 DIAGNOSIS — N6002 Solitary cyst of left breast: Secondary | ICD-10-CM

## 2021-05-23 NOTE — Assessment & Plan Note (Signed)
Overdue mammogram.  Need to schedule.  Order placed. ?

## 2021-05-23 NOTE — Assessment & Plan Note (Signed)
Tolerating micardis.  Continue.  Follow pressures.  Follow metabolic panel.  ?

## 2021-05-23 NOTE — Assessment & Plan Note (Signed)
Elected not to take effexor.  She is taking magnesium and vitamin E.  Follow.  ?

## 2021-05-23 NOTE — Telephone Encounter (Signed)
Overdue mammogram.  Need to schedule.   

## 2021-05-24 ENCOUNTER — Other Ambulatory Visit: Payer: Self-pay

## 2021-05-24 DIAGNOSIS — N6002 Solitary cyst of left breast: Secondary | ICD-10-CM

## 2021-05-24 NOTE — Telephone Encounter (Signed)
S/w jennifer, advised pt needs diagnostic mammo - per Victorino Dike , need [place] added for recheck on L breast cyst.  ?[place] added, jennifer will be calling pt to schedule.  ?

## 2021-05-25 NOTE — Telephone Encounter (Signed)
Order placed for diagnostic mammogram and left breast ultrasound.  ?

## 2021-05-25 NOTE — Addendum Note (Signed)
Addended by: Charm Barges on: 05/25/2021 12:44 AM ? ? Modules accepted: Orders ? ?

## 2021-05-31 ENCOUNTER — Other Ambulatory Visit: Payer: Self-pay | Admitting: Internal Medicine

## 2021-06-27 DIAGNOSIS — M722 Plantar fascial fibromatosis: Secondary | ICD-10-CM | POA: Diagnosis not present

## 2021-08-11 ENCOUNTER — Encounter: Payer: Self-pay | Admitting: Internal Medicine

## 2021-08-11 DIAGNOSIS — S93402A Sprain of unspecified ligament of left ankle, initial encounter: Secondary | ICD-10-CM | POA: Diagnosis not present

## 2021-08-11 DIAGNOSIS — M25572 Pain in left ankle and joints of left foot: Secondary | ICD-10-CM | POA: Diagnosis not present

## 2021-09-16 ENCOUNTER — Ambulatory Visit: Payer: BC Managed Care – PPO | Admitting: Internal Medicine

## 2021-09-28 ENCOUNTER — Encounter: Payer: Self-pay | Admitting: Internal Medicine

## 2021-09-28 DIAGNOSIS — M722 Plantar fascial fibromatosis: Secondary | ICD-10-CM | POA: Insufficient documentation

## 2021-09-29 ENCOUNTER — Ambulatory Visit: Payer: BC Managed Care – PPO | Admitting: Internal Medicine

## 2021-10-28 ENCOUNTER — Encounter: Payer: Self-pay | Admitting: Internal Medicine

## 2021-11-03 ENCOUNTER — Other Ambulatory Visit: Payer: Self-pay | Admitting: Internal Medicine

## 2021-11-28 ENCOUNTER — Ambulatory Visit: Payer: BC Managed Care – PPO | Admitting: Internal Medicine

## 2022-03-13 ENCOUNTER — Other Ambulatory Visit: Payer: Self-pay | Admitting: Internal Medicine

## 2022-03-30 ENCOUNTER — Ambulatory Visit
Admission: RE | Admit: 2022-03-30 | Discharge: 2022-03-30 | Disposition: A | Discharge status: Home / Self Care | Payer: BC Managed Care – PPO | Source: Ambulatory Visit | Attending: Urgent Care | Admitting: Urgent Care

## 2022-03-30 VITALS — BP 139/85 | HR 66 | Temp 97.9°F | Resp 18

## 2022-03-30 DIAGNOSIS — J069 Acute upper respiratory infection, unspecified: Secondary | ICD-10-CM | POA: Diagnosis not present

## 2022-03-30 DIAGNOSIS — J0100 Acute maxillary sinusitis, unspecified: Secondary | ICD-10-CM

## 2022-03-30 HISTORY — DX: Essential (primary) hypertension: I10

## 2022-03-30 MED ORDER — PREDNISONE 20 MG PO TABS: ORAL_TABLET | ORAL | 0 refills | Status: AC

## 2022-03-30 NOTE — ED Provider Notes (Signed)
[name]    CSN: [id number] Arrival date & time: 03/30/22  1027      History   Chief Complaint Chief Complaint  Patient presents with   Nasal Congestion    also have headache & cough - Entered by patient    HPI Amanda Rojas is a 52 y.o. female.   HPI  Presents to urgent care with 4-day history of nasal congestion, headache, cough.  Denies fever.  Endorses close contact with her grandchildren who have had "RSV" and a "GI bug".  She complains of sinus pain and pressure, especially on the right side of her face behind her eye.  Feels "tightness" in her breathing.  Denies history of asthma or COPD.  Past Medical History:  Diagnosis Date   History of HPV infection    abnormal pap s/p cryosurgery   Hx of migraine headaches    menstrual migraines   Hypertension    Kidney stones     Patient Active Problem List   Diagnosis Date Noted   Plantar fasciitis 09/28/2021   Breast cancer screening 05/05/2021   Soft tissue mass 05/05/2021   Hypertension, essential 01/03/2021   Nocturia 12/04/2020   Hot flashes 09/12/2020   Left hip pain 09/10/2020   Headache 01/20/2018   Breast cyst 10/21/2014   Breast nodule 10/19/2014   Health care maintenance 10/19/2014   Fatigue 10/16/2014   Hemorrhoid 06/25/2013   Bladder prolapse, female, acquired 06/25/2013   Abnormal mammogram 05/26/2013   Nephrolithiasis 01/15/2012   Iron deficiency 01/15/2012   Abnormal Pap smear of cervix 01/15/2012    Past Surgical History:  Procedure Laterality Date   WRIST FRACTURE SURGERY  1996   metal plate placed    OB History     Gravida  2   Para  2   Term      Preterm      AB      Living  2      SAB      IAB      Ectopic      Multiple      Live Births           Obstetric Comments  Menstrual age: 15  Age 1st Pregnancy: 14           Home Medications    Prior to Admission medications   Medication Sig Start Date End Date Taking? Authorizing  Provider  telmisartan (MICARDIS) 40 MG tablet TAKE 1 TABLET(40 MG) BY MOUTH DAILY 03/13/22   Einar Pheasant, MD    Family History Family History  Problem Relation Age of Onset   Arthritis Mother    Thyroid disease Mother    Heart disease Maternal Grandmother    Breast cancer Cousin 63       subsequently developed acute leukemia   Prostate cancer Neg Hx    Bladder Cancer Neg Hx     Social History Social History   Tobacco Use   Smoking status: Never   Smokeless tobacco: Never  Substance Use Topics   Alcohol use: Yes    Alcohol/week: 0.0 standard drinks of alcohol   Drug use: No     Allergies   Percocet [oxycodone-acetaminophen]   Review of Systems Review of Systems   Physical Exam Triage Vital Signs ED Triage Vitals  Enc Vitals Group     BP 03/30/22 1040 139/85     Pulse Rate 03/30/22 1040 66     Resp 03/30/22 1040 18  Temp 03/30/22 1040 97.9 F (36.6 C)     Temp src --      SpO2 03/30/22 1040 98 %     Weight --      Height --      Head Circumference --      Peak Flow --      Pain Score 03/30/22 1035 0     Pain Loc --      Pain Edu? --      Excl. in [place]? --    No data found.  Updated Vital Signs BP 139/85   Pulse 66   Temp 97.9 F (36.6 C)   Resp 18   LMP 03/12/2022   SpO2 98%   Visual Acuity Right Eye Distance:   Left Eye Distance:   Bilateral Distance:    Right Eye Near:   Left Eye Near:    Bilateral Near:     Physical Exam Vitals reviewed.  Constitutional:      Appearance: Normal appearance. She is ill-appearing.  HENT:     Right Ear: Tympanic membrane normal.     Left Ear: Tympanic membrane normal.     Mouth/Throat:     Pharynx: Posterior oropharyngeal erythema present. No oropharyngeal exudate.  Cardiovascular:     Rate and Rhythm: Normal rate and regular rhythm.     Pulses: Normal pulses.     Heart sounds: Normal heart sounds.  Pulmonary:     Effort: Pulmonary effort is normal.     Breath sounds: Normal breath sounds.   Neurological:     General: No focal deficit present.     Mental Status: She is alert and oriented to person, place, and time.  Psychiatric:        Mood and Affect: Mood normal.        Behavior: Behavior normal.      UC Treatments / Results  Labs (all labs ordered are listed, but only abnormal results are displayed) Labs Reviewed - No data to display  EKG   Radiology No results found.  Procedures Procedures (including critical care time)  Medications Ordered in UC Medications - No data to display  Initial Impression / Assessment and Plan / UC Course  I have reviewed the triage vital signs and the nursing notes.  Pertinent labs & imaging results that were available during my care of the patient were reviewed by me and considered in my medical decision making (see chart for details).   Patient is afebrile here without recent antipyretics. Satting well on room air. Overall is ill appearing, well hydrated, without respiratory distress. Pulmonary exam is unremarkable.  Lungs CTAB without wheezing, rhonchi, rales.  Mild pharyngeal erythema without peritonsillar exudates.  Given duration of symptoms, suspect an acute viral process, including RSV given her reported positive exposure.  We discussed the relative risk of infection such as RSV and agreed we would treat her symptoms as a URI.  She endorses severe sinus symptoms and discomfort and agreed to prescribe a course of prednisone.  Otherwise recommending continued use of OTC medication for symptom control.  Final Clinical Impressions(s) / UC Diagnoses   Final diagnoses:  None   Discharge Instructions   None    ED Prescriptions   None    PDMP not reviewed this encounter.   Amanda Rojas, [place] 03/30/22 1103

## 2022-03-30 NOTE — Discharge Instructions (Addendum)
Follow up here or with your primary care provider if your symptoms are worsening or not improving with treatment.     

## 2022-03-30 NOTE — ED Triage Notes (Addendum)
Patient to Urgent Care with complaints of nasal congestion/ headaches/ cough. Denies any known fevers.  Symptoms started four days ago. Reports her grandchildren have rsv/ a GI bug. Has been in close contact with both.   Has been taking coricidin.

## 2022-04-10 ENCOUNTER — Telehealth: Payer: BC Managed Care – PPO | Admitting: Physician Assistant

## 2022-04-10 DIAGNOSIS — B9689 Other specified bacterial agents as the cause of diseases classified elsewhere: Secondary | ICD-10-CM

## 2022-04-10 DIAGNOSIS — J019 Acute sinusitis, unspecified: Secondary | ICD-10-CM

## 2022-04-10 MED ORDER — AMOXICILLIN-POT CLAVULANATE 875-125 MG PO TABS: 1.0000 | ORAL_TABLET | Freq: Two times a day (BID) | ORAL | 0 refills | Status: DC

## 2022-04-10 NOTE — Progress Notes (Signed)

## 2022-04-10 NOTE — Progress Notes (Signed)
I have spent 5 minutes in review of e-visit questionnaire, review and updating patient chart, medical decision making and response to patient.   Jabin Tapp Cody Baruch Lewers, PA-C    

## 2022-04-19 ENCOUNTER — Ambulatory Visit: Payer: BC Managed Care – PPO | Admitting: Dermatology

## 2022-04-19 DIAGNOSIS — L739 Follicular disorder, unspecified: Secondary | ICD-10-CM | POA: Diagnosis not present

## 2022-04-19 MED ORDER — MUPIROCIN 2 % EX OINT: TOPICAL_OINTMENT | CUTANEOUS | 0 refills | Status: DC

## 2022-04-19 MED ORDER — TRIAMCINOLONE ACETONIDE 0.1 % EX CREA: TOPICAL_CREAM | CUTANEOUS | 0 refills | Status: DC

## 2022-04-19 NOTE — Patient Instructions (Signed)
Due to recent changes in healthcare laws, you may see results of your pathology and/or laboratory studies on MyChart before the doctors have had a chance to review them. We understand that in some cases there may be results that are confusing or concerning to you. Please understand that not all results are received at the same time and often the doctors may need to interpret multiple results in order to provide you with the best plan of care or course of treatment. Therefore, we ask that you please give us 2 business days to thoroughly review all your results before contacting the office for clarification. Should we see a critical lab result, you will be contacted sooner.   If You Need Anything After Your Visit  If you have any questions or concerns for your doctor, please call our main line at [phone] and press option 4 to reach your doctor's medical assistant. If no one answers, please leave a voicemail as directed and we will return your call as soon as possible. Messages left after 4 pm will be answered the following business day.   You may also send us a message via MyChart. We typically respond to MyChart messages within 1-2 business days.  For prescription refills, please ask your pharmacy to contact our office. Our fax number is [phone].  If you have an urgent issue when the clinic is closed that cannot wait until the next business day, you can page your doctor at the number below.    Please note that while we do our best to be available for urgent issues outside of office hours, we are not available 24/7.   If you have an urgent issue and are unable to reach us, you may choose to seek medical care at your doctor's office, retail clinic, urgent care center, or emergency room.  If you have a medical emergency, please immediately call 911 or go to the emergency department.  Pager Numbers  - Dr. Kowalski: [phone]  - Dr. Moye: [phone]  - Dr. Stewart:  [phone]  In the event of inclement weather, please call our main line at [phone] for an update on the status of any delays or closures.  Dermatology Medication Tips: Please keep the boxes that topical medications come in in order to help keep track of the instructions about where and how to use these. Pharmacies typically print the medication instructions only on the boxes and not directly on the medication tubes.   If your medication is too expensive, please contact our office at [phone] option 4 or send us a message through MyChart.   We are unable to tell what your co-pay for medications will be in advance as this is different depending on your insurance coverage. However, we may be able to find a substitute medication at lower cost or fill out paperwork to get insurance to cover a needed medication.   If a prior authorization is required to get your medication covered by your insurance company, please allow us 1-2 business days to complete this process.  Drug prices often vary depending on where the prescription is filled and some pharmacies may offer cheaper prices.  The website www.goodrx.com contains coupons for medications through different pharmacies. The prices here do not account for what the cost may be with help from insurance (it may be cheaper with your insurance), but the website can give you the price if you did not use any insurance.  - You can print the associated coupon and take it with   your prescription to the pharmacy.  - You may also stop by our office during regular business hours and pick up a GoodRx coupon card.  - If you need your prescription sent electronically to a different pharmacy, notify our office through [place] MyChart or by phone at [phone] option 4.     Si Usted Necesita Algo Despus de Su Visita  Tambin puede enviarnos un mensaje a travs de MyChart. Por lo general respondemos a los mensajes de MyChart en el transcurso de 1 a 2  das hbiles.  Para renovar recetas, por favor pida a su farmacia que se ponga en contacto con nuestra oficina. Nuestro nmero de fax es el [phone].  Si tiene un asunto urgente cuando la clnica est cerrada y que no puede esperar hasta el siguiente da hbil, puede llamar/localizar a su doctor(a) al nmero que aparece a continuacin.   Por favor, tenga en cuenta que aunque hacemos todo lo posible para estar disponibles para asuntos urgentes fuera del horario de oficina, no estamos disponibles las 24 horas del da, los 7 das de la semana.   Si tiene un problema urgente y no puede comunicarse con nosotros, puede optar por buscar atencin mdica  en el consultorio de su doctor(a), en una clnica privada, en un centro de atencin urgente o en una sala de emergencias.  Si tiene una emergencia mdica, por favor llame inmediatamente al 911 o vaya a la sala de emergencias.  Nmeros de bper  - Dr. Kowalski: [phone]  - Dra. Moye: [phone]  - Dra. Stewart: [phone]  En caso de inclemencias del tiempo, por favor llame a nuestra lnea principal al [phone] para una actualizacin sobre el estado de cualquier retraso o cierre.  Consejos para la medicacin en dermatologa: Por favor, guarde las cajas en las que vienen los medicamentos de uso tpico para ayudarle a seguir las instrucciones sobre dnde y cmo usarlos. Las farmacias generalmente imprimen las instrucciones del medicamento slo en las cajas y no directamente en los tubos del medicamento.   Si su medicamento es muy caro, por favor, pngase en contacto con nuestra oficina llamando al [phone] y presione la opcin 4 o envenos un mensaje a travs de MyChart.   No podemos decirle cul ser su copago por los medicamentos por adelantado ya que esto es diferente dependiendo de la cobertura de su seguro. Sin embargo, es posible que podamos encontrar un medicamento sustituto a menor costo o llenar un formulario para que el  seguro cubra el medicamento que se considera necesario.   Si se requiere una autorizacin previa para que su compaa de seguros cubra su medicamento, por favor permtanos de 1 a 2 das hbiles para completar este proceso.  Los precios de los medicamentos varan con frecuencia dependiendo del lugar de dnde se surte la receta y alguna farmacias pueden ofrecer precios ms baratos.  El sitio web www.goodrx.com tiene cupones para medicamentos de diferentes farmacias. Los precios aqu no tienen en cuenta lo que podra costar con la ayuda del seguro (puede ser ms barato con su seguro), pero el sitio web puede darle el precio si no utiliz ningn seguro.  - Puede imprimir el cupn correspondiente y llevarlo con su receta a la farmacia.  - Tambin puede pasar por nuestra oficina durante el horario de atencin regular y recoger una tarjeta de cupones de GoodRx.  - Si necesita que su receta se enve electrnicamente a una farmacia diferente, informe a nuestra oficina a travs de MyChart de [place]   o por telfono llamando al [phone] y presione la opcin 4.  

## 2022-04-19 NOTE — Progress Notes (Signed)
   New Patient Visit  Subjective  Amanda Rojas is a 52 y.o. female who presents for the following: Red spot (Left lower leg, unsure how long area has been there, but she feels like it was scratched recently. Has been using Neosporin to area. ). Not itchy. Noticed area was really red this week. A little tender to touch.   The following portions of the chart were reviewed this encounter and updated as appropriate:       Review of Systems:  No other skin or systemic complaints except as noted in HPI or Assessment and Plan.  Objective  Well appearing patient in no apparent distress; mood and affect are within normal limits.  A focused examination was performed including face, left leg. Relevant physical exam findings are noted in the Assessment and Plan.  Left Lower Leg - Anterior Excoriation with surrounding erythema    Assessment & Plan   Folliculitis Left Lower Leg - Anterior  Vs Bite Reaction vs Inflamed SK  Start TMC 0.1% Cream Apply first to AA BID until improved dsp 15g 0Rf. Topical steroids (such as triamcinolone, fluocinolone, fluocinonide, mometasone, clobetasol, halobetasol, betamethasone, hydrocortisone) can cause thinning and lightening of the skin if they are used for too long in the same area. Your physician has selected the right strength medicine for your problem and area affected on the body. Please use your medication only as directed by your physician to prevent side effects.   Start mupirocin 2% ointment Apply second to AA BID until improved dsp 22g 0Rf  Recheck on f/up  mupirocin ointment (BACTROBAN) 2 % - Left Lower Leg - Anterior Apply to affected area left leg twice daily until improved. Apply 2nd.  triamcinolone cream (KENALOG) 0.1 % - Left Lower Leg - Anterior Apply to affected area left leg twice daily until improved. Apply 1st.   Return in about 4 weeks (around 05/17/2022) for TBSE, recheck leg.  IJamesetta Orleans, CMA, am acting as scribe for  Brendolyn Patty, MD .  Documentation: I have reviewed the above documentation for accuracy and completeness, and I agree with the above.  Brendolyn Patty MD

## 2022-05-16 ENCOUNTER — Ambulatory Visit: Payer: BC Managed Care – PPO | Admitting: Dermatology

## 2022-05-16 ENCOUNTER — Other Ambulatory Visit: Payer: Self-pay | Admitting: Internal Medicine

## 2022-05-16 ENCOUNTER — Encounter: Payer: Self-pay | Admitting: Dermatology

## 2022-05-16 VITALS — BP 126/79 | HR 67

## 2022-05-16 DIAGNOSIS — L821 Other seborrheic keratosis: Secondary | ICD-10-CM

## 2022-05-16 DIAGNOSIS — L739 Follicular disorder, unspecified: Secondary | ICD-10-CM | POA: Diagnosis not present

## 2022-05-16 DIAGNOSIS — L578 Other skin changes due to chronic exposure to nonionizing radiation: Secondary | ICD-10-CM

## 2022-05-16 DIAGNOSIS — L814 Other melanin hyperpigmentation: Secondary | ICD-10-CM | POA: Diagnosis not present

## 2022-05-16 DIAGNOSIS — D229 Melanocytic nevi, unspecified: Secondary | ICD-10-CM

## 2022-05-16 DIAGNOSIS — Z1283 Encounter for screening for malignant neoplasm of skin: Secondary | ICD-10-CM | POA: Diagnosis not present

## 2022-05-16 DIAGNOSIS — D1801 Hemangioma of skin and subcutaneous tissue: Secondary | ICD-10-CM

## 2022-05-16 NOTE — Patient Instructions (Addendum)
For bags under eyes  Recommend Alastin eye cream - sold here-  1/2 pump twice daily  Recommend Restylane eyelight filler to soften under eye  For mid face filler  Recommend voluma $700  Discussed Botox at forehead  25 units for $325   To treat brown spots at face  Counseling for BBL / IPL / Laser and Coordination of Care Discussed the treatment option of Broad Band Light (BBL) Daron Offer Pulsed Light (IPL)/ Laser for skin discoloration, including brown spots and redness.  Typically we recommend at least 1-3 treatment sessions about 5-8 weeks apart for best results.  Cannot have tanned skin when BBL performed, and regular use of sunscreen is advised after the procedure to help maintain results. The patient's condition may also require "maintenance treatments" in the future.  The fee for BBL / laser treatments is $350 per treatment session for the whole face.  A fee can be quoted for other parts of the body.  Insurance typically does not pay for BBL/laser treatments and therefore the fee is an out-of-pocket cost.      Basic OTC daily skin care regimen to prevent photoaging:   Recommend facial moisturizer with sunscreen SPF 30 every morning (OTC brands include CeraVe AM, Neutrogena, Eucerin, Cetaphil, Aveeno, La Roche Posay).  Can also apply a topical Vit C serum which is an antioxidant (OTC brands include CeraVe, La Roche Posay, and The Ordinary) underneath sunscreen in morning. If you are outside during the day in the summer for extended periods, especially swimming and/or sweating, make sure you apply a water resistant facial sunscreen lotion spf 30 or higher.   At night recommend a cream with retinol (a vitamin A derivative which stimulates collagen production) like CeraVe skin renewing retinol serum or ROC retinol correxion cream or Neutrogena rapid wrinkle repair cream. Retinol may cause skin irritation in people with sensitive skin.  Can use it every other day and/or apply on top of a  hyaluronic acid (HA) moisturizer/serum (Neutrogena Hydroboost water cream) if better tolerated that way.  Retinol may also help with lightening brown spots.   Our office sells high quality, medically tested skin care lines such as Elta MD sunscreens (with Zinc), and Alastin skin care products, which are very effective in treating photoaging. The Alastin line includes cosmeceutical grade Vit.C serum, HA serum, Elastin stimulating moisturizers/serums, lightening serum, and sunscreens.  If you want prescription treatment, then you would need an appointment (Rx tretinoin and fade creams, Botox, filler injections, laser treatments, etc.) These prescriptions and procedures are not covered by insurance but work very well.   Recommend using [employer] daily, leave on for 1-2 minutes before rinsing off. This can be purchased online  Folliculitis  Folliculitis occurs when hair follicles become inflamed. A hair follicle is a tiny opening in your skin where your hair grows from. This condition often occurs on the scalp, thighs, legs, back, and buttocks but can happen anywhere on the body. What are the causes? A common cause of this condition is an infection from bacteria. The type of folliculitis caused by bacteria can last a long time or go away and come back. The bacteria can live anywhere on your skin. They are often found in the nostrils. Other causes may include: An infection from a fungus. An infection from a virus. Your skin touching some chemicals, such as oils and tars. Shaving or waxing. Greasy ointments or creams put on the skin. What increases the risk? You are more likely to develop this  condition if: Your body has a weak disease-fighting system (immune system). You have diabetes. You are obese. What are the signs or symptoms? Symptoms of this condition include: Redness. Soreness. Swelling. Itching. Small white or yellow, itchy spots filled with pus (pustules) that appear over a  red area. If the infection goes deep into the follicle, these may turn into a boil (furuncle). A group of boils (carbuncle). These tend to form in hairy, sweaty areas of the body. How is this diagnosed? This condition is diagnosed with a skin exam. Your health care provider may take a sample of one of the pustules or boils to test in a lab. How is this treated? This condition may be treated by: Putting a warm, wet cloth (warm compress) on the affected areas. Taking antibiotics or applying them to the skin. Applying or bathing with a solution that kills germs (antiseptic). Taking an over-the-counter medicine. This can help with itching. Having a procedure to drain pustules or boils. This may be done if a pustule or boil contains a lot of pus or fluid. Having laser hair removal. This may be done when the condition lasts for a long time. Follow these instructions at home: Managing pain and swelling  If directed, apply heat to the affected area as often as told by your health care provider. Use the heat source that your health care provider recommends, such as a moist heat pack or a heating pad. Place a towel between your skin and the heat source. Leave the heat on for 20-30 minutes. If your skin turns bright red, remove the heat right away to prevent burns. The risk of burns is higher if you cannot feel pain, heat, or cold. General instructions Take over-the-counter and prescription medicines only as told by your health care provider. If you were prescribed antibiotics, take or apply them as told by your health care provider. Do not stop using the antibiotic even if you start to feel better. Check your irritated area every day for signs of infection. Check for: More redness, swelling, or pain. Fluid or blood. Warmth. Pus or a bad smell. Do not shave irritated skin. Keep all follow-up visits. Your health care provider will check if the treatments are helping. Contact a health care provider  if: You have a fever. You have any signs of infection. Red streaks are spreading from the affected area. This information is not intended to replace advice given to you by your health care provider. Make sure you discuss any questions you have with your health care provider. Document Revised: 07/12/2021 Document Reviewed: 07/12/2021 Elsevier Patient Education  [employer] is the most dangerous type of skin cancer, and is the leading cause of death from skin disease.  You are more likely to develop melanoma if you: Have light-colored skin, light-colored eyes, or red or blond hair Spend a lot of time in the sun Tan regularly, either outdoors or in a tanning bed Have had blistering sunburns, especially during childhood Have a close family member who has had a melanoma Have atypical moles or large birthmarks  Early detection of melanoma is key since treatment is typically straightforward and cure rates are extremely high if we catch it early.   The first sign of melanoma is often a change in a mole or a new dark spot.  The ABCDE system is a way of remembering the signs of melanoma.  A for asymmetry:  The two halves do not match. B for border:  The edges of the growth are irregular. C for color:  A mixture of colors are present instead of an even brown color. D for diameter:  Melanomas are usually (but not always) greater than 80mm - the size of a pencil eraser. E for evolution:  The spot keeps changing in size, shape, and color.  Please check your skin once per month between visits. You can use a small mirror in front and a large mirror behind you to keep an eye on the back side or your body.   If you see any new or changing lesions before your next follow-up, please call to schedule a visit.  Please continue daily skin protection including broad spectrum sunscreen SPF 30+ to sun-exposed areas, reapplying every 2 hours as needed when you're outdoors.    Staying in the shade or wearing long sleeves, sun glasses (UVA+UVB protection) and wide brim hats (4-inch brim around the entire circumference of the hat) are also recommended for sun protection.    Due to recent changes in healthcare laws, you may see results of your pathology and/or laboratory studies on MyChart before the doctors have had a chance to review them. We understand that in some cases there may be results that are confusing or concerning to you. Please understand that not all results are received at the same time and often the doctors may need to interpret multiple results in order to provide you with the best plan of care or course of treatment. Therefore, we ask that you please give [place] 2 business days to thoroughly review all your results before contacting the office for clarification. Should we see a critical lab result, you will be contacted sooner.   If You Need Anything After Your Visit  If you have any questions or concerns for your doctor, please call our main line at [phone] and press option 4 to reach your doctor's medical assistant. If no one answers, please leave a voicemail as directed and we will return your call as soon as possible. Messages left after 4 pm will be answered the following business day.   You may also send [place] a message via [place]. We typically respond to MyChart messages within 1-2 business days.  For prescription refills, please ask your pharmacy to contact our office. Our fax number is [phone].  If you have an urgent issue when the clinic is closed that cannot wait until the next business day, you can page your doctor at the number below.    Please note that while we do our best to be available for urgent issues outside of office hours, we are not available 24/7.   If you have an urgent issue and are unable to reach [place], you may choose to seek medical care at your doctor's office, retail clinic, urgent care center, or emergency room.  If  you have a medical emergency, please immediately call 911 or go to the emergency department.  Pager Numbers  - Dr. Nehemiah Massed: [phone]  - Dr. Laurence Ferrari: [phone]  - Dr. Nicole Kindred: [phone]  In the event of inclement weather, please call our main line at [phone] for an update on the status of any delays or closures.  Dermatology Medication Tips: Please keep the boxes that topical medications come in in order to help keep track of the instructions about where and how to use these. Pharmacies typically print the medication instructions only on the boxes and not directly on the medication tubes.   If your medication is too expensive,  please contact our office at [phone] option 4 or send [place] a message through [place].   We are unable to tell what your co-pay for medications will be in advance as this is different depending on your insurance coverage. However, we may be able to find a substitute medication at lower cost or fill out paperwork to get insurance to cover a needed medication.   If a prior authorization is required to get your medication covered by your insurance company, please allow [place] 1-2 business days to complete this process.  Drug prices often vary depending on where the prescription is filled and some pharmacies may offer cheaper prices.  The website www.goodrx.com contains coupons for medications through different pharmacies. The prices here do not account for what the cost may be with help from insurance (it may be cheaper with your insurance), but the website can give you the price if you did not use any insurance.  - You can print the associated coupon and take it with your prescription to the pharmacy.  - You may also stop by our office during regular business hours and pick up a GoodRx coupon card.  - If you need your prescription sent electronically to a different pharmacy, notify our office through [facility] or by phone at [phone] option  4.     Si Usted Necesita Algo Despus de Su Visita  Tambin puede enviarnos un mensaje a travs de Pharmacist, community. Por lo general respondemos a los mensajes de MyChart en el transcurso de 1 a 2 das hbiles.  Para renovar recetas, por favor pida a su farmacia que se ponga en contacto con nuestra oficina. Harland Dingwall de fax es [place] [phone].  Si tiene un asunto urgente cuando la clnica est cerrada y que no puede esperar hasta el siguiente da hbil, puede llamar/localizar a su doctor(a) al nmero que aparece a continuacin.   Por favor, tenga en cuenta que aunque hacemos todo lo posible para estar disponibles para asuntos urgentes fuera del horario de [place], no estamos disponibles las 24 horas del da, los 7 das de la [place].   Si tiene un problema urgente y no puede comunicarse con nosotros, puede optar por buscar atencin mdica  en el consultorio de su doctor(a), en una clnica privada, en un centro de atencin urgente o en una sala de emergencias.  Si tiene Engineering geologist, por favor llame inmediatamente al 911 o vaya a la sala de emergencias.  Nmeros de bper  - Dr. Nehemiah Massed: [phone]  - Dra. Moye: [phone]  - Dra. Nicole Kindred: [phone]  En caso de inclemencias del [place], por favor llame a Johnsie Kindred principal al [phone] para una actualizacin sobre el [place] de cualquier retraso o cierre.  Consejos para la medicacin en dermatologa: Por favor, guarde las cajas en las que vienen los medicamentos de uso tpico para ayudarle a seguir las instrucciones sobre dnde y cmo usarlos. Las farmacias generalmente imprimen las instrucciones del medicamento slo en las cajas y no directamente en los tubos del [place].   Si su medicamento es muy caro, por favor, pngase en contacto con Zigmund Daniel llamando al [phone] y presione la opcin 4 o envenos un mensaje a travs de Pharmacist, community.   No podemos decirle cul ser su copago por los medicamentos por  adelantado ya que esto es diferente dependiendo de la cobertura de su seguro. Sin embargo, es posible que podamos encontrar un medicamento sustituto a Electrical engineer un formulario para que el [place]  el medicamento que se considera necesario.   Si se requiere una autorizacin previa para que su compaa de seguros [place] su medicamento, por favor permtanos de 1 a 2 das hbiles para completar este proceso.  Los precios de los medicamentos varan con frecuencia dependiendo del Environmental consultant de dnde se surte la receta y alguna farmacias pueden ofrecer precios ms baratos.  El sitio web www.goodrx.com tiene cupones para medicamentos de Airline pilot. Los precios aqu no tienen en cuenta lo que podra costar con la ayuda del seguro (puede ser ms barato con su seguro), pero el sitio web puede darle el precio si no utiliz Research scientist (physical sciences).  - Puede imprimir el cupn correspondiente y llevarlo con su receta a la farmacia.  - Tambin puede pasar por nuestra oficina durante el horario de atencin regular y Charity fundraiser una tarjeta de cupones de GoodRx.  - Si necesita que su receta se enve electrnicamente a una farmacia diferente, informe a nuestra oficina a travs de MyChart de [place] o por telfono llamando al [phone] y presione la opcin 4.

## 2022-05-16 NOTE — Progress Notes (Signed)
Follow-Up Visit   Subjective  Amanda Rojas is a 52 y.o. female who presents for the following: Skin Cancer Screening and Full Body Skin Exam Recheck left leg- hx of folliculitis vs bite reaction. Patient given rx of tmc 0.1 cream to use. Reports area has pretty much resolved, just discolored.  The patient presents for Total-Body Skin Exam (TBSE) for skin cancer screening and mole check. The patient has spots, moles and lesions to be evaluated, some may be new or changing and the patient has concerns that these could be cancer.  She is interested in discussing cosmetic treatment options for wrinkles and discoloration on face  The following portions of the chart were reviewed this encounter and updated as appropriate: medications, allergies, medical history  Review of Systems:  No other skin or systemic complaints except as noted in HPI or Assessment and Plan.  Objective  Well appearing patient in no apparent distress; mood and affect are within normal limits.  A full examination was performed including scalp, head, eyes, ears, nose, lips, neck, chest, axillae, abdomen, back, buttocks, bilateral upper extremities, bilateral lower extremities, hands, feet, fingers, toes, fingernails, and toenails. All findings within normal limits unless otherwise noted below.      Assessment & Plan   FOLLICULITIS  Exam: smooth pink macule left pretibia, follicular pink papules on shoulders and upper back  Treatment Plan: Resolving on leg, will continue to fade, observe for changes Can d/c tmc since improved  Recommend using Cln Sport Wash daily to trunk in shower, leave on for 1-2 minutes before rinsing off. This can be purchased online. If not improving, can try Rx.   LENTIGINES - Scattered tan macules face, chest - Due to sun exposure - Benign-appearing, observe - Recommend daily broad spectrum sunscreen SPF 30+ to sun-exposed areas, reapply every 2 hours as needed. - Call for any  changes  Counseling for BBL / IPL / Laser and Coordination of Care Discussed the treatment option of Broad Band Light (BBL) /Intense Pulsed Light (IPL)/ Laser for skin discoloration, including brown spots and redness.  Typically we recommend at least 1-3 treatment sessions about 5-8 weeks apart for best results.  Cannot have tanned skin when BBL performed, and regular use of sunscreen is advised after the procedure to help maintain results. The patient's condition may also require "maintenance treatments" in the future.  The fee for BBL / laser treatments is $350 per treatment session for the whole face.  A fee can be quoted for other parts of the body.  Insurance typically does not pay for BBL/laser treatments and therefore the fee is an out-of-pocket cost.    SEBORRHEIC KERATOSES, HEMANGIOMAS - Benign normal skin lesions - Benign-appearing - Call for any changes  MELANOCYTIC NEVI - Tan-brown and/or pink-flesh-colored symmetric macules and papules - Benign appearing on exam today - Observation - Call clinic for new or changing moles - Recommend daily use of broad spectrum spf 30+ sunscreen to sun-exposed areas.   FACIAL ELASTOSIS Exam: Rhytides and volume loss.  Pt concerned with under eye bags  Treatment Plan: Discussed botox at glabella/forehead 25 units for $325, lasts 3-4 mos Recommend Alastin eye cream 1/2 pump twice daily for the under eye area  Consult with Dr. Laurence Ferrari Discussed filler under eye- restylane eyelight- do all canula due to prominent vasculature Filler will soften the transition and not remove undereye puffiness, which would require surgery Discussed Filler mid face cheek- Voluma need more volume at left last up to 2 years, $  700 per syringe Recommend doing cheek filler first and may require less under eye filler.  Recommend Basic OTC daily skin care regimen to prevent photoaging:   Recommend facial moisturizer with sunscreen SPF 30 every morning (OTC brands include  CeraVe AM, Neutrogena, Eucerin, Cetaphil, Aveeno, La Roche Posay).  Can also apply a topical Vit C serum which is an antioxidant (OTC brands include CeraVe, La Roche Posay, and The Ordinary) underneath sunscreen in morning. If you are outside during the day in the summer for extended periods, especially swimming and/or sweating, make sure you apply a water resistant facial sunscreen lotion spf 30 or higher.   At night recommend a cream with retinol (a vitamin A derivative which stimulates collagen production) like CeraVe skin renewing retinol serum or ROC retinol correxion cream or Neutrogena rapid wrinkle repair cream. Retinol may cause skin irritation in people with sensitive skin.  Can use it every other day and/or apply on top of a hyaluronic acid (HA) moisturizer/serum (Neutrogena Hydroboost water cream) if better tolerated that way.  Retinol may also help with lightening brown spots.   Our office sells high quality, medically tested skin care lines such as Elta MD sunscreens (with Zinc), and Alastin skin care products, which are very effective in treating photoaging. The Alastin line includes cosmeceutical grade Vit.C serum, HA serum, Elastin stimulating moisturizers/serums, lightening serum, and sunscreens.  If you want prescription treatment, then you would need an appointment (Rx tretinoin and fade creams, Botox, filler injections, laser treatments, etc.) These prescriptions and procedures are not covered by insurance but work very well.   Recommend daily broad spectrum sunscreen SPF 30+ to sun-exposed areas, reapply every 2 hours as needed. Call for new or changing lesions.  Staying in the shade or wearing long sleeves, sun glasses (UVA+UVB protection) and wide brim hats (4-inch brim around the entire circumference of the hat) are also recommended for sun protection.   ACTINIC DAMAGE - Chronic condition, secondary to cumulative UV/sun exposure - diffuse scaly erythematous macules with  underlying dyspigmentation - Recommend daily broad spectrum sunscreen SPF 30+ to sun-exposed areas, reapply every 2 hours as needed.  - Staying in the shade or wearing long sleeves, sun glasses (UVA+UVB protection) and wide brim hats (4-inch brim around the entire circumference of the hat) are also recommended for sun protection.  - Call for new or changing lesions.  SKIN CANCER SCREENING PERFORMED TODAY.   Return in about 1 year (around 05/16/2023) for TBSE.  I, Ruthell Rummage, CMA, am acting as scribe for Brendolyn Patty, MD.  Documentation: I have reviewed the above documentation for accuracy and completeness, and I agree with the above.  Brendolyn Patty MD    Documentation: I have reviewed the above documentation for accuracy and completeness, and I agree with the above.  Brendolyn Patty, MD

## 2022-05-29 ENCOUNTER — Telehealth: Payer: Self-pay | Admitting: Internal Medicine

## 2022-05-29 DIAGNOSIS — I10 Essential (primary) hypertension: Secondary | ICD-10-CM

## 2022-05-29 DIAGNOSIS — Z13220 Encounter for screening for lipoid disorders: Secondary | ICD-10-CM

## 2022-05-29 DIAGNOSIS — Z1231 Encounter for screening mammogram for malignant neoplasm of breast: Secondary | ICD-10-CM

## 2022-05-29 NOTE — Addendum Note (Signed)
Addended by: Rita Ohara D on: 05/29/2022 04:18 PM   Modules accepted: Orders

## 2022-05-29 NOTE — Telephone Encounter (Addendum)
Patient returned office phone call. CPE scheduled. Patient is requesting a referral at [facility] for a diagnostic mammogram.  Patient called office back  and would like to have labs before CPE. No lab orders

## 2022-05-29 NOTE — Telephone Encounter (Signed)
Notify - overdue mammogram.  Please schedule.  Also overdue physical/f/u with me. Please schedule.

## 2022-05-30 NOTE — Telephone Encounter (Signed)
Labs ordered and appt scheduled. Mammo ordered. Pt is aware.

## 2022-05-30 NOTE — Addendum Note (Signed)
Addended by: Rita Ohara D on: 05/30/2022 07:49 AM   Modules accepted: Orders

## 2022-06-22 ENCOUNTER — Ambulatory Visit
Admission: RE | Admit: 2022-06-22 | Discharge: 2022-06-22 | Disposition: A | Discharge status: Home / Self Care | Payer: BC Managed Care – PPO | Source: Ambulatory Visit | Attending: Internal Medicine | Admitting: Internal Medicine

## 2022-06-22 DIAGNOSIS — Z1231 Encounter for screening mammogram for malignant neoplasm of breast: Secondary | ICD-10-CM

## 2022-07-10 ENCOUNTER — Other Ambulatory Visit (INDEPENDENT_AMBULATORY_CARE_PROVIDER_SITE_OTHER): Payer: BC Managed Care – PPO

## 2022-07-10 DIAGNOSIS — I10 Essential (primary) hypertension: Secondary | ICD-10-CM

## 2022-07-10 DIAGNOSIS — Z13220 Encounter for screening for lipoid disorders: Secondary | ICD-10-CM | POA: Diagnosis not present

## 2022-07-10 LAB — CBC WITH DIFFERENTIAL/PLATELET
Basophils Absolute: 0 10*3/uL (ref 0.0–0.1)
Basophils Relative: 0.3 % (ref 0.0–3.0)
Eosinophils Absolute: 0.1 10*3/uL (ref 0.0–0.7)
Eosinophils Relative: 1.5 % (ref 0.0–5.0)
HCT: 40.9 % (ref 36.0–46.0)
Hemoglobin: 13.8 g/dL (ref 12.0–15.0)
Lymphocytes Relative: 33.8 % (ref 12.0–46.0)
Lymphs Abs: 1.8 10*3/uL (ref 0.7–4.0)
MCHC: 33.8 g/dL (ref 30.0–36.0)
MCV: 84.8 fl (ref 78.0–100.0)
Monocytes Absolute: 0.4 10*3/uL (ref 0.1–1.0)
Monocytes Relative: 8.5 % (ref 3.0–12.0)
Neutro Abs: 2.9 10*3/uL (ref 1.4–7.7)
Neutrophils Relative %: 55.9 % (ref 43.0–77.0)
Platelets: 196 10*3/uL (ref 150.0–400.0)
RBC: 4.83 Mil/uL (ref 3.87–5.11)
RDW: 13.4 % (ref 11.5–15.5)
WBC: 5.2 10*3/uL (ref 4.0–10.5)

## 2022-07-10 LAB — LIPID PANEL
Cholesterol: 165 mg/dL (ref 0–200)
HDL: 58.4 mg/dL (ref >39.00)
LDL Cholesterol: 95 mg/dL (ref 0–99)
NonHDL: 106.7
Total CHOL/HDL Ratio: 3
Triglycerides: 57 mg/dL (ref 0.0–149.0)
VLDL: 11.4 mg/dL (ref 0.0–40.0)

## 2022-07-10 LAB — HEPATIC FUNCTION PANEL
ALT: 12 U/L (ref 0–35)
AST: 17 U/L (ref 0–37)
Albumin: 4.4 g/dL (ref 3.5–5.2)
Alkaline Phosphatase: 77 U/L (ref 39–117)
Bilirubin, Direct: 0.1 mg/dL (ref 0.0–0.3)
Total Bilirubin: 0.6 mg/dL (ref 0.2–1.2)
Total Protein: 7.2 g/dL (ref 6.0–8.3)

## 2022-07-10 LAB — BASIC METABOLIC PANEL
BUN: 21 mg/dL (ref 6–23)
CO2: 29 mEq/L (ref 19–32)
Calcium: 9.5 mg/dL (ref 8.4–10.5)
Chloride: 104 mEq/L (ref 96–112)
Creatinine, Ser: 0.74 mg/dL (ref 0.40–1.20)
GFR: 92.42 mL/min (ref >60.00)
Glucose, Bld: 86 mg/dL (ref 70–99)
Potassium: 4 mEq/L (ref 3.5–5.1)
Sodium: 143 mEq/L (ref 135–145)

## 2022-07-11 LAB — TSH: TSH: 1.78 u[IU]/mL (ref 0.35–5.50)

## 2022-07-13 ENCOUNTER — Other Ambulatory Visit (HOSPITAL_COMMUNITY)
Admission: RE | Admit: 2022-07-13 | Discharge: 2022-07-13 | Disposition: A | Discharge status: Home / Self Care | Payer: BC Managed Care – PPO | Source: Ambulatory Visit | Attending: Internal Medicine | Admitting: Internal Medicine

## 2022-07-13 ENCOUNTER — Ambulatory Visit (INDEPENDENT_AMBULATORY_CARE_PROVIDER_SITE_OTHER): Payer: BC Managed Care – PPO | Admitting: Internal Medicine

## 2022-07-13 VITALS — BP 118/80 | HR 59 | Temp 98.2°F | Ht 65.0 in | Wt 145.0 lb

## 2022-07-13 DIAGNOSIS — Z0000 Encounter for general adult medical examination without abnormal findings: Secondary | ICD-10-CM

## 2022-07-13 DIAGNOSIS — E611 Iron deficiency: Secondary | ICD-10-CM

## 2022-07-13 DIAGNOSIS — I10 Essential (primary) hypertension: Secondary | ICD-10-CM | POA: Diagnosis not present

## 2022-07-13 MED ORDER — TELMISARTAN 40 MG PO TABS: ORAL_TABLET | ORAL | 1 refills | Status: DC

## 2022-07-13 NOTE — Assessment & Plan Note (Addendum)
Physical today 07/13/22.  Mammogram 06/22/22 - Birads I. Discussed due colonoscopy. Notify when agreeable.  PAP 12/2019 - negative with negative HPV.  PAP today.

## 2022-07-13 NOTE — Progress Notes (Signed)
Subjective:    Patient ID: Amanda Rojas, female    DOB: October 15, 1970, 52 y.o.   MRN: [id number]  Patient here for her physical exam.   HPI Here for physical exam. On micardis for blood pressure.  Needs colonoscopy.  Discussed.  Will call me when ready for referral to be placed.  Increased stress.  Oldest daughter recently diagnosed with breast cancer and undergoing treatment.  Handling stress well.  Has good support.  No chest pain or sob reported.  Is exercising.  Working out with a Psychologist, educational.  No bowel change reported.  No vaginal problems.    Past Medical History:  Diagnosis Date   History of HPV infection    abnormal pap s/p cryosurgery   Hx of migraine headaches    menstrual migraines   Hypertension    Kidney stones    Past Surgical History:  Procedure Laterality Date   WRIST FRACTURE SURGERY  1996   metal plate placed   Family History  Problem Relation Age of Onset   Arthritis Mother    Thyroid disease Mother    Breast cancer Daughter 75   Heart disease Maternal Grandmother    Breast cancer Cousin 85       subsequently developed acute leukemia   Prostate cancer Neg Hx    Bladder Cancer Neg Hx    Social History   Socioeconomic History   Marital status: Married    Spouse name: Not on file   Number of children: 3   Years of education: Not on file   Highest education level: Not on file  Occupational History    Employer: [employer]  Tobacco Use   Smoking status: Never   Smokeless tobacco: Never  Substance and Sexual Activity   Alcohol use: Yes    Alcohol/week: 0.0 standard drinks of alcohol   Drug use: No   Sexual activity: Not on file  Other Topics Concern   Not on file  Social History Narrative   Not on file   Social Determinants of Health   Financial Resource Strain: Not on file  Food Insecurity: Not on file  Transportation Needs: Not on file  Physical Activity: Not on file  Stress: Not on file  Social Connections: Not on file      Review of Systems  Constitutional:  Negative for appetite change and unexpected weight change.  HENT:  Negative for congestion, sinus pressure and sore throat.   Eyes:  Negative for pain and visual disturbance.  Respiratory:  Negative for cough, chest tightness and shortness of breath.   Cardiovascular:  Negative for chest pain and palpitations.  Gastrointestinal:  Negative for abdominal pain, diarrhea, nausea and vomiting.  Genitourinary:  Negative for difficulty urinating and dysuria.  Musculoskeletal:  Negative for joint swelling and myalgias.  Skin:  Negative for color change and rash.  Neurological:  Negative for dizziness and headaches.  Hematological:  Negative for adenopathy. Does not bruise/bleed easily.  Psychiatric/Behavioral:  Negative for agitation and dysphoric mood.        Objective:     BP 118/80 (BP Location: Left Arm, Patient Position: Sitting, Cuff Size: Normal)   Pulse (!) 59   Temp 98.2 F (36.8 C) (Oral)   Ht 5\' 5"  (1.651 m)   Wt 145 lb (65.8 kg)   SpO2 99%   BMI 24.13 kg/m  Wt Readings from Last 3 Encounters:  07/13/22 145 lb (65.8 kg)  05/17/21 151 lb (68.5 kg)  05/04/21 151 lb 9.6 oz (  68.8 kg)    Physical Exam Vitals reviewed.  Constitutional:      General: She is not in acute distress.    Appearance: Normal appearance. She is well-developed.  HENT:     Head: Normocephalic and atraumatic.     Right Ear: External ear normal.     Left Ear: External ear normal.  Eyes:     General: No scleral icterus.       Right eye: No discharge.        Left eye: No discharge.     Conjunctiva/sclera: Conjunctivae normal.  Neck:     Thyroid: No thyromegaly.  Cardiovascular:     Rate and Rhythm: Normal rate and regular rhythm.  Pulmonary:     Effort: No tachypnea, accessory muscle usage or respiratory distress.     Breath sounds: Normal breath sounds. No decreased breath sounds or wheezing.  Chest:  Breasts:    Right: No inverted nipple, mass,  nipple discharge or tenderness (no axillary adenopathy).     Left: No inverted nipple, mass, nipple discharge or tenderness (no axilarry adenopathy).  Abdominal:     General: Bowel sounds are normal.     Palpations: Abdomen is soft.     Tenderness: There is no abdominal tenderness.  Genitourinary:    Comments: Normal external genitalia.  Vaginal vault without lesions.  Cervix identified.  Pap smear performed.  Could not appreciate any adnexal masses or tenderness.   Musculoskeletal:        General: No swelling or tenderness.     Cervical back: Neck supple.  Lymphadenopathy:     Cervical: No cervical adenopathy.  Skin:    Findings: No erythema or rash.  Neurological:     Mental Status: She is alert and oriented to person, place, and time.  Psychiatric:        Mood and Affect: Mood normal.        Behavior: Behavior normal.      Outpatient Encounter Medications as of 07/13/2022  Medication Sig   mupirocin ointment (BACTROBAN) 2 % Apply to affected area left leg twice daily until improved. Apply 2nd.   triamcinolone cream (KENALOG) 0.1 % Apply to affected area left leg twice daily until improved. Apply 1st.   telmisartan (MICARDIS) 40 MG tablet TAKE 1 TABLET(40 MG) BY MOUTH DAILY   [DISCONTINUED] telmisartan (MICARDIS) 40 MG tablet TAKE 1 TABLET(40 MG) BY MOUTH DAILY   No facility-administered encounter medications on file as of 07/13/2022.     Lab Results  Component Value Date   WBC 5.2 07/10/2022   HGB 13.8 07/10/2022   HCT 40.9 07/10/2022   PLT 196.0 07/10/2022   GLUCOSE 86 07/10/2022   CHOL 165 07/10/2022   TRIG 57.0 07/10/2022   HDL 58.40 07/10/2022   LDLCALC 95 07/10/2022   ALT 12 07/10/2022   AST 17 07/10/2022   NA 143 07/10/2022   K 4.0 07/10/2022   CL 104 07/10/2022   CREATININE 0.74 07/10/2022   BUN 21 07/10/2022   CO2 29 07/10/2022   TSH 1.78 07/10/2022    MM 3D SCREENING MAMMOGRAM BILATERAL BREAST  Result Date: 06/23/2022 CLINICAL DATA:  Screening.  History of bilateral breast cysts. EXAM: DIGITAL SCREENING BILATERAL MAMMOGRAM WITH TOMOSYNTHESIS AND CAD TECHNIQUE: Bilateral screening digital craniocaudal and mediolateral oblique mammograms were obtained. Bilateral screening digital breast tomosynthesis was performed. The images were evaluated with computer-aided detection. COMPARISON:  Previous exam(s). ACR Breast Density Category c: The breasts are heterogeneously dense, which may obscure small masses. FINDINGS:  There are no findings suspicious for malignancy. IMPRESSION: No mammographic evidence of malignancy. A result letter of this screening mammogram will be mailed directly to the patient. RECOMMENDATION: Screening mammogram in one year. (Code:SM-B-01Y) BI-RADS CATEGORY  1: Negative. Electronically Signed   By: Bary Richard M.D.   On: 06/23/2022 13:15       Assessment & Plan:  Health care maintenance Assessment & Plan: Physical today 07/13/22.  Mammogram 06/22/22 - Birads I. Discussed due colonoscopy. Notify when agreeable.  PAP 12/2019 - negative with negative HPV.  PAP today.   Orders: -     Cytology - PAP  Hypertension, essential Assessment & Plan: Tolerating micardis.  Continue.  Follow pressures.  Follow metabolic panel.    Iron deficiency Assessment & Plan: Previously on iron.  Has been off iron.  Follow cbc. Hgb 07/10/22 - wnl.    Other orders -     Telmisartan; TAKE 1 TABLET(40 MG) BY MOUTH DAILY  Dispense: 90 tablet; Refill: 1     Dale McPherson, MD

## 2022-07-19 LAB — CYTOLOGY - PAP

## 2022-07-21 LAB — CYTOLOGY - PAP
Comment: NEGATIVE
Diagnosis: NEGATIVE

## 2022-07-22 ENCOUNTER — Encounter: Payer: Self-pay | Admitting: Internal Medicine

## 2022-07-22 NOTE — Assessment & Plan Note (Signed)
Previously on iron.  Has been off iron.  Follow cbc. Hgb 07/10/22 - wnl.

## 2022-07-22 NOTE — Assessment & Plan Note (Signed)
Tolerating micardis.  Continue.  Follow pressures.  Follow metabolic panel.  ?

## 2022-08-04 ENCOUNTER — Encounter: Payer: Self-pay | Admitting: Internal Medicine

## 2022-08-04 NOTE — Telephone Encounter (Signed)
Yes

## 2022-08-10 NOTE — Telephone Encounter (Signed)
New patient scheduled and my chart link sent,

## 2023-01-15 ENCOUNTER — Ambulatory Visit: Payer: BC Managed Care – PPO | Admitting: Internal Medicine

## 2023-02-10 ENCOUNTER — Other Ambulatory Visit: Payer: Self-pay | Admitting: Internal Medicine

## 2023-02-12 ENCOUNTER — Encounter: Payer: Self-pay | Admitting: Internal Medicine

## 2023-02-13 ENCOUNTER — Encounter: Payer: Self-pay | Admitting: Family Medicine

## 2023-02-13 ENCOUNTER — Ambulatory Visit: Payer: BC Managed Care – PPO | Admitting: Family Medicine

## 2023-02-13 VITALS — BP 134/84 | HR 72 | Temp 97.9°F | Ht 65.0 in | Wt 154.8 lb

## 2023-02-13 DIAGNOSIS — R0981 Nasal congestion: Secondary | ICD-10-CM

## 2023-02-13 DIAGNOSIS — I10 Essential (primary) hypertension: Secondary | ICD-10-CM

## 2023-02-13 LAB — CBC
HCT: 41.5 % (ref 36.0–46.0)
Hemoglobin: 13.9 g/dL (ref 12.0–15.0)
MCHC: 33.5 g/dL (ref 30.0–36.0)
MCV: 85.5 fL (ref 78.0–100.0)
Platelets: 214 10*3/uL (ref 150.0–400.0)
RBC: 4.85 Mil/uL (ref 3.87–5.11)
RDW: 13.1 % (ref 11.5–15.5)
WBC: 5.1 10*3/uL (ref 4.0–10.5)

## 2023-02-13 LAB — COMPREHENSIVE METABOLIC PANEL
ALT: 17 U/L (ref 0–35)
AST: 20 U/L (ref 0–37)
Albumin: 4.2 g/dL (ref 3.5–5.2)
Alkaline Phosphatase: 66 U/L (ref 39–117)
BUN: 23 mg/dL (ref 6–23)
CO2: 28 meq/L (ref 19–32)
Calcium: 9.1 mg/dL (ref 8.4–10.5)
Chloride: 106 meq/L (ref 96–112)
Creatinine, Ser: 0.79 mg/dL (ref 0.40–1.20)
GFR: 85.09 mL/min (ref >60.00)
Glucose, Bld: 85 mg/dL (ref 70–99)
Potassium: 4 meq/L (ref 3.5–5.1)
Sodium: 139 meq/L (ref 135–145)
Total Bilirubin: 0.7 mg/dL (ref 0.2–1.2)
Total Protein: 7.1 g/dL (ref 6.0–8.3)

## 2023-02-13 LAB — TSH: TSH: 1.35 u[IU]/mL (ref 0.35–5.50)

## 2023-02-13 MED ORDER — AMOXICILLIN-POT CLAVULANATE 875-125 MG PO TABS
1.0000 | ORAL_TABLET | Freq: Two times a day (BID) | ORAL | 0 refills | Status: DC
Start: 2023-02-13 — End: 2023-03-21

## 2023-02-13 MED ORDER — FLUTICASONE PROPIONATE 50 MCG/ACT NA SUSP
2.0000 | Freq: Every day | NASAL | 0 refills | Status: AC
Start: 2023-02-13 — End: ?

## 2023-02-13 NOTE — Assessment & Plan Note (Addendum)
Chronic issue with exacerbation.  BP was elevated over the last few days though has returned back to a more normal range today.  Discussed continuing monitoring her blood pressure.  If it becomes elevated again we can always add additional medication.  For now she will continue telmisartan 40 mg daily.  Checking labs as outlined.  Headache could be related to sinus issues or her blood pressure.

## 2023-02-13 NOTE — Progress Notes (Signed)
Marikay Alar, MD Phone: [phone]  Amanda Rojas is a 52 y.o. female who presents today for same day visit.   HYPERTENSION Disease Monitoring Home BP Monitoring 136-155/90-104 Chest pain- no    Dyspnea- no Medications Compliance-  taking telmisartan  Edema- no Reports some headache with this.  Noted some right eye pain.  No vision changes.  No numbness.  No weakness. BMET    Component Value Date/Time   NA 143 07/10/2022 0935   K 4.0 07/10/2022 0935   CL 104 07/10/2022 0935   CO2 29 07/10/2022 0935   GLUCOSE 86 07/10/2022 0935   BUN 21 07/10/2022 0935   CREATININE 0.74 07/10/2022 0935   CALCIUM 9.5 07/10/2022 0935   Sinus congestion: Patient notes this has been going on a couple of weeks.  She feels congested in her sinuses.  Has been coughing up some mucus.  No fever recently though felt feverish last week.  Did some Zyrtec nasal spray with some benefit.  Social History   Tobacco Use  Smoking Status Never  Smokeless Tobacco Never    Current Outpatient Medications on File Prior to Visit  Medication Sig Dispense Refill   telmisartan (MICARDIS) 40 MG tablet TAKE 1 TABLET(40 MG) BY MOUTH DAILY 90 tablet 1   No current facility-administered medications on file prior to visit.     ROS see history of present illness  Objective  Physical Exam Vitals:   02/13/23 0823  BP: 134/84  Pulse: 72  Temp: 97.9 F (36.6 C)  SpO2: 97%   Vision-corrected with glasses Left eye 20/25 Right eye 20/20 Both eyes 20/20  BP Readings from Last 3 Encounters:  02/13/23 134/84  07/13/22 118/80  05/16/22 126/79   Wt Readings from Last 3 Encounters:  02/13/23 154 lb 12.8 oz (70.2 kg)  07/13/22 145 lb (65.8 kg)  05/17/21 151 lb (68.5 kg)    Physical Exam Constitutional:      General: She is not in acute distress.    Appearance: She is not diaphoretic.  HENT:     Head:     Comments: No sinus tenderness to percussion    Right Ear: Tympanic membrane normal.     Left  Ear: Tympanic membrane normal.  Cardiovascular:     Rate and Rhythm: Normal rate and regular rhythm.     Heart sounds: Normal heart sounds.  Pulmonary:     Effort: Pulmonary effort is normal.     Breath sounds: Normal breath sounds.  Lymphadenopathy:     Cervical: No cervical adenopathy.  Skin:    General: Skin is warm and dry.  Neurological:     Mental Status: She is alert.      Assessment/Plan: Please see individual problem list.  Hypertension, essential Assessment & Plan: Chronic issue with exacerbation.  BP was elevated over the last few days though has returned back to a more normal range today.  Discussed continuing monitoring her blood pressure.  If it becomes elevated again we can always add additional medication.  For now she will continue telmisartan 40 mg daily.  Checking labs as outlined.  Headache could be related to sinus issues or her blood pressure.  Orders: -     Comprehensive metabolic panel -     TSH -     CBC  Sinus congestion Assessment & Plan: Ongoing issue over the last several weeks.  Could be related to allergies versus sinus infection.  Discussed a trial of Flonase for several days to see if this  is beneficial.  If not beneficial she can start on the Augmentin that was sent to her pharmacy.  Advised on the risk of diarrhea with the Augmentin.  Orders: -     Fluticasone Propionate; Place 2 sprays into both nostrils daily.  Dispense: 16 g; Refill: 0 -     Amoxicillin-Pot Clavulanate; Take 1 tablet by mouth 2 (two) times daily.  Dispense: 14 tablet; Refill: 0     Return if symptoms worsen or fail to improve.   Marikay Alar, MD [facility]

## 2023-02-13 NOTE — Assessment & Plan Note (Signed)
Ongoing issue over the last several weeks.  Could be related to allergies versus sinus infection.  Discussed a trial of Flonase for several days to see if this is beneficial.  If not beneficial she can start on the Augmentin that was sent to her pharmacy.  Advised on the risk of diarrhea with the Augmentin.

## 2023-03-21 ENCOUNTER — Encounter: Payer: Self-pay | Admitting: Internal Medicine

## 2023-03-21 ENCOUNTER — Ambulatory Visit: Payer: BC Managed Care – PPO | Admitting: Internal Medicine

## 2023-03-21 VITALS — BP 126/70 | HR 78 | Temp 98.2°F | Resp 16 | Ht 65.0 in | Wt 157.4 lb

## 2023-03-21 DIAGNOSIS — Z862 Personal history of diseases of the blood and blood-forming organs and certain disorders involving the immune mechanism: Secondary | ICD-10-CM

## 2023-03-21 DIAGNOSIS — I10 Essential (primary) hypertension: Secondary | ICD-10-CM | POA: Diagnosis not present

## 2023-03-21 DIAGNOSIS — R0981 Nasal congestion: Secondary | ICD-10-CM

## 2023-03-21 DIAGNOSIS — R232 Flushing: Secondary | ICD-10-CM

## 2023-03-21 LAB — CBC WITH DIFFERENTIAL/PLATELET
Basophils Absolute: 0 10*3/uL (ref 0.0–0.1)
Basophils Relative: 0.3 % (ref 0.0–3.0)
Eosinophils Absolute: 0.1 10*3/uL (ref 0.0–0.7)
Eosinophils Relative: 1.3 % (ref 0.0–5.0)
HCT: 41 % (ref 36.0–46.0)
Hemoglobin: 13.9 g/dL (ref 12.0–15.0)
Lymphocytes Relative: 32.7 % (ref 12.0–46.0)
Lymphs Abs: 1.8 10*3/uL (ref 0.7–4.0)
MCHC: 33.8 g/dL (ref 30.0–36.0)
MCV: 85.5 fL (ref 78.0–100.0)
Monocytes Absolute: 0.5 10*3/uL (ref 0.1–1.0)
Monocytes Relative: 8.3 % (ref 3.0–12.0)
Neutro Abs: 3.2 10*3/uL (ref 1.4–7.7)
Neutrophils Relative %: 57.4 % (ref 43.0–77.0)
Platelets: 202 10*3/uL (ref 150.0–400.0)
RBC: 4.8 Mil/uL (ref 3.87–5.11)
RDW: 13.1 % (ref 11.5–15.5)
WBC: 5.5 10*3/uL (ref 4.0–10.5)

## 2023-03-21 LAB — IBC + FERRITIN
Ferritin: 61.6 ng/mL (ref 10.0–291.0)
Iron: 75 ug/dL (ref 42–145)
Saturation Ratios: 22.9 % (ref 20.0–50.0)
TIBC: 327.6 ug/dL (ref 250.0–450.0)
Transferrin: 234 mg/dL (ref 212.0–360.0)

## 2023-03-21 MED ORDER — TELMISARTAN 40 MG PO TABS: ORAL_TABLET | ORAL | 1 refills | Status: DC

## 2023-03-21 NOTE — Patient Instructions (Signed)
Saline nasal spray - flush nose 1-2x/day  Flonase (or nasacort) nasal spray - 2 sprays each nostril one time per day. Do this in the evening.

## 2023-03-21 NOTE — Assessment & Plan Note (Signed)
No hot flashes now.  Taking vitamin E. Will stop. Follow.

## 2023-03-21 NOTE — Assessment & Plan Note (Signed)
Check cbc and iron studies today. Taking an iron supplement daily now.

## 2023-03-21 NOTE — Assessment & Plan Note (Signed)
Tolerating micardis.  Continue.  Follow pressures.  Follow metabolic panel. Blood pressure doing well.  118/72 today on my check.

## 2023-03-21 NOTE — Progress Notes (Signed)
Subjective:    Patient ID: Amanda Rojas, female    DOB: November 13, 1970, 53 y.o.   MRN: [id number]  Patient here for  Chief Complaint  Patient presents with   Medical Management of Chronic Issues    HPI Here for a scheduled follow up - f/u regarding her blood pressure. On micardis. Increased stress. Family medical stress. Daughter is doing well.  Overall she feels she is handling things relatively well. Due colonoscopy. Wants to hold at this time. Staying active. Exercising. Breathing stable. Does report allergy symptoms - nasal congestion, drainage, runny nose. Ongoing issue for her. Better recently when traveled to [place].    Past Medical History:  Diagnosis Date   History of HPV infection    abnormal pap s/p cryosurgery   Hx of migraine headaches    menstrual migraines   Hypertension    Kidney stones    Past Surgical History:  Procedure Laterality Date   WRIST FRACTURE SURGERY  1996   metal plate placed   Family History  Problem Relation Age of Onset   Arthritis Mother    Thyroid disease Mother    Breast cancer Daughter 21   Heart disease Maternal Grandmother    Breast cancer Cousin 22       subsequently developed acute leukemia   Prostate cancer Neg Hx    Bladder Cancer Neg Hx    Social History   Socioeconomic History   Marital status: Married    Spouse name: Not on file   Number of children: 3   Years of education: Not on file   Highest education level: Not on file  Occupational History    Employer: [employer]  Tobacco Use   Smoking status: Never   Smokeless tobacco: Never  Substance and Sexual Activity   Alcohol use: Yes    Alcohol/week: 0.0 standard drinks of alcohol   Drug use: No   Sexual activity: Not on file  Other Topics Concern   Not on file  Social History Narrative   Not on file   Social Drivers of Health   Financial Resource Strain: Not on file  Food Insecurity: Not on file  Transportation Needs: Not on file  Physical  Activity: Not on file  Stress: Not on file  Social Connections: Not on file     Review of Systems  Constitutional:  Negative for appetite change and unexpected weight change.  HENT:  Negative for sinus pressure.        Nasal congestion as outlined.   Respiratory:  Negative for cough, chest tightness and shortness of breath.   Cardiovascular:  Negative for chest pain, palpitations and leg swelling.  Gastrointestinal:  Negative for abdominal pain, diarrhea, nausea and vomiting.  Genitourinary:  Negative for difficulty urinating and dysuria.  Musculoskeletal:  Negative for joint swelling and myalgias.  Skin:  Negative for color change and rash.  Neurological:  Negative for dizziness and headaches.  Psychiatric/Behavioral:  Negative for agitation and dysphoric mood.        Objective:     BP 126/70   Pulse 78   Temp 98.2 F (36.8 C)   Resp 16   Ht 5\' 5"  (1.651 m)   Wt 157 lb 6.4 oz (71.4 kg)   SpO2 98%   BMI 26.19 kg/m  Wt Readings from Last 3 Encounters:  03/21/23 157 lb 6.4 oz (71.4 kg)  02/13/23 154 lb 12.8 oz (70.2 kg)  07/13/22 145 lb (65.8 kg)    Physical Exam  Vitals reviewed.  Constitutional:      General: She is not in acute distress.    Appearance: Normal appearance.  HENT:     Head: Normocephalic and atraumatic.     Right Ear: External ear normal.     Left Ear: External ear normal.     Mouth/Throat:     Pharynx: No oropharyngeal exudate or posterior oropharyngeal erythema.  Eyes:     General: No scleral icterus.       Right eye: No discharge.        Left eye: No discharge.     Conjunctiva/sclera: Conjunctivae normal.  Neck:     Thyroid: No thyromegaly.  Cardiovascular:     Rate and Rhythm: Normal rate and regular rhythm.  Pulmonary:     Effort: No respiratory distress.     Breath sounds: Normal breath sounds. No wheezing.  Abdominal:     General: Bowel sounds are normal.     Palpations: Abdomen is soft.     Tenderness: There is no abdominal  tenderness.  Musculoskeletal:        General: No swelling or tenderness.     Cervical back: Neck supple. No tenderness.  Lymphadenopathy:     Cervical: No cervical adenopathy.  Skin:    Findings: No erythema or rash.  Neurological:     Mental Status: She is alert.  Psychiatric:        Mood and Affect: Mood normal.        Behavior: Behavior normal.         Outpatient Encounter Medications as of 03/21/2023  Medication Sig   fluticasone (FLONASE) 50 MCG/ACT nasal spray Place 2 sprays into both nostrils daily.   telmisartan (MICARDIS) 40 MG tablet TAKE 1 TABLET(40 MG) BY MOUTH DAILY   [DISCONTINUED] amoxicillin-clavulanate (AUGMENTIN) 875-125 MG tablet Take 1 tablet by mouth 2 (two) times daily.   [DISCONTINUED] telmisartan (MICARDIS) 40 MG tablet TAKE 1 TABLET(40 MG) BY MOUTH DAILY   No facility-administered encounter medications on file as of 03/21/2023.     Lab Results  Component Value Date   WBC 5.1 02/13/2023   HGB 13.9 02/13/2023   HCT 41.5 02/13/2023   PLT 214.0 02/13/2023   GLUCOSE 85 02/13/2023   CHOL 165 07/10/2022   TRIG 57.0 07/10/2022   HDL 58.40 07/10/2022   LDLCALC 95 07/10/2022   ALT 17 02/13/2023   AST 20 02/13/2023   NA 139 02/13/2023   K 4.0 02/13/2023   CL 106 02/13/2023   CREATININE 0.79 02/13/2023   BUN 23 02/13/2023   CO2 28 02/13/2023   TSH 1.35 02/13/2023       Assessment & Plan:  History of anemia Assessment & Plan: Check cbc and iron studies today. Taking an iron supplement daily now.   Orders: -     IBC + Ferritin -     CBC with Differential/Platelet  Hypertension, essential Assessment & Plan: Tolerating micardis.  Continue.  Follow pressures.  Follow metabolic panel. Blood pressure doing well.  118/72 today on my check.    Hot flashes Assessment & Plan: No hot flashes now.  Taking vitamin E. Will stop. Follow.    Sinus congestion Assessment & Plan: Nasal congestion. No sinus pressure. No fever. No chest congestion.   Saline nasal spray and flonase as directed.  Follow. Call with update.    Other orders -     Telmisartan; TAKE 1 TABLET(40 MG) BY MOUTH DAILY  Dispense: 90 tablet; Refill: 1  Dale Hemet, MD

## 2023-03-21 NOTE — Assessment & Plan Note (Signed)
Nasal congestion. No sinus pressure. No fever. No chest congestion.  Saline nasal spray and flonase as directed.  Follow. Call with update.

## 2023-03-23 ENCOUNTER — Other Ambulatory Visit: Payer: Self-pay

## 2023-03-23 DIAGNOSIS — E611 Iron deficiency: Secondary | ICD-10-CM

## 2023-03-26 ENCOUNTER — Encounter: Payer: Self-pay | Admitting: Internal Medicine

## 2023-03-27 NOTE — Telephone Encounter (Signed)
Patient is going to the pharmacy to get her shingles vaccine

## 2023-05-29 ENCOUNTER — Encounter: Payer: BC Managed Care – PPO | Admitting: Dermatology

## 2023-06-26 ENCOUNTER — Other Ambulatory Visit: Payer: BC Managed Care – PPO

## 2023-07-19 ENCOUNTER — Other Ambulatory Visit (INDEPENDENT_AMBULATORY_CARE_PROVIDER_SITE_OTHER)

## 2023-07-19 DIAGNOSIS — E611 Iron deficiency: Secondary | ICD-10-CM | POA: Diagnosis not present

## 2023-07-19 LAB — CBC WITH DIFFERENTIAL/PLATELET
Basophils Absolute: 0 10*3/uL (ref 0.0–0.1)
Basophils Relative: 0.6 % (ref 0.0–3.0)
Eosinophils Absolute: 0.2 10*3/uL (ref 0.0–0.7)
Eosinophils Relative: 3.4 % (ref 0.0–5.0)
HCT: 41.4 % (ref 36.0–46.0)
Hemoglobin: 13.9 g/dL (ref 12.0–15.0)
Lymphocytes Relative: 28.5 % (ref 12.0–46.0)
Lymphs Abs: 1.8 10*3/uL (ref 0.7–4.0)
MCHC: 33.5 g/dL (ref 30.0–36.0)
MCV: 84.8 fl (ref 78.0–100.0)
Monocytes Absolute: 0.4 10*3/uL (ref 0.1–1.0)
Monocytes Relative: 6.5 % (ref 3.0–12.0)
Neutro Abs: 3.9 10*3/uL (ref 1.4–7.7)
Neutrophils Relative %: 61 % (ref 43.0–77.0)
Platelets: 220 10*3/uL (ref 150.0–400.0)
RBC: 4.89 Mil/uL (ref 3.87–5.11)
RDW: 13 % (ref 11.5–15.5)
WBC: 6.5 10*3/uL (ref 4.0–10.5)

## 2023-07-19 LAB — IBC + FERRITIN
Ferritin: 59 ng/mL (ref 10.0–291.0)
Iron: 67 ug/dL (ref 42–145)
Saturation Ratios: 18.3 % — ABNORMAL LOW (ref 20.0–50.0)
TIBC: 365.4 ug/dL (ref 250.0–450.0)
Transferrin: 261 mg/dL (ref 212.0–360.0)

## 2023-07-20 ENCOUNTER — Ambulatory Visit: Payer: Self-pay | Admitting: Internal Medicine

## 2023-08-07 ENCOUNTER — Encounter: Payer: Self-pay | Admitting: Internal Medicine

## 2023-08-07 NOTE — Telephone Encounter (Signed)
 Ok

## 2023-08-07 NOTE — Telephone Encounter (Signed)
Please schedule new pt appt.

## 2023-09-19 ENCOUNTER — Encounter: Payer: Self-pay | Admitting: Internal Medicine

## 2023-09-19 ENCOUNTER — Ambulatory Visit (INDEPENDENT_AMBULATORY_CARE_PROVIDER_SITE_OTHER): Payer: BC Managed Care – PPO | Admitting: Internal Medicine

## 2023-09-19 VITALS — BP 118/72 | HR 63 | Resp 16 | Ht 65.0 in | Wt 156.4 lb

## 2023-09-19 DIAGNOSIS — I10 Essential (primary) hypertension: Secondary | ICD-10-CM

## 2023-09-19 DIAGNOSIS — Z1231 Encounter for screening mammogram for malignant neoplasm of breast: Secondary | ICD-10-CM

## 2023-09-19 DIAGNOSIS — R5383 Other fatigue: Secondary | ICD-10-CM

## 2023-09-19 DIAGNOSIS — Z13220 Encounter for screening for lipoid disorders: Secondary | ICD-10-CM

## 2023-09-19 DIAGNOSIS — Z0000 Encounter for general adult medical examination without abnormal findings: Secondary | ICD-10-CM | POA: Diagnosis not present

## 2023-09-19 DIAGNOSIS — E611 Iron deficiency: Secondary | ICD-10-CM

## 2023-09-19 MED ORDER — TELMISARTAN 40 MG PO TABS: ORAL_TABLET | ORAL | 1 refills | Status: DC

## 2023-09-19 NOTE — Assessment & Plan Note (Signed)
 Continue micardis . Blood pressures as outlined. May be increased due to increase stress. Continue to spot check her pressures. Continue micardis . Hold on making any changes. Check metabolic panel.

## 2023-09-19 NOTE — Assessment & Plan Note (Signed)
 Increased fatigue. Discussed. Probably multifactorial. Increased stress with her mother-n-law's health issues. Off iron. Check cbc and iron studies as well as routine labs.  Request am cortisol level to be checked.

## 2023-09-19 NOTE — Assessment & Plan Note (Signed)
 Off iron. Follow cbc and iron studies.

## 2023-09-19 NOTE — Assessment & Plan Note (Addendum)
 Physical today 09/19/23.  Mammogram 06/22/22 - Birads I. Due mammogram. Scheduled. Discussed due colonoscopy. Notify when agreeable.  PAP 07/13/22 - negative with negative HPV.

## 2023-09-19 NOTE — Assessment & Plan Note (Signed)
Overdue mammogram.  Schedule mammogram.

## 2023-09-19 NOTE — Progress Notes (Signed)
 Subjective:    Patient ID: Amanda Rojas, female    DOB: 1970-05-16, 54 y.o.   MRN: [id number]  Patient here for  Chief Complaint  Patient presents with   Annual Exam    HPI Here for a physical exam. Continues on micardis . Has noticed blood pressures varying recently. One reading 149/93. Most 116-130/70-80s. Increased stress. Mother-n-law with increased health issues and dementia. Her and her husband - caretakers for her. Also reports increased fatigue. Breathing stable. Bowels doing well with miralax.    Past Medical History:  Diagnosis Date   History of HPV infection    abnormal pap s/p cryosurgery   Hx of migraine headaches    menstrual migraines   Hypertension    Kidney stones    Past Surgical History:  Procedure Laterality Date   WRIST FRACTURE SURGERY  1996   metal plate placed   Family History  Problem Relation Age of Onset   Arthritis Mother    Thyroid  disease Mother    Breast cancer Daughter 2   Heart disease Maternal Grandmother    Breast cancer Cousin 31       subsequently developed acute leukemia   Prostate cancer Neg Hx    Bladder Cancer Neg Hx    Social History   Socioeconomic History   Marital status: Married    Spouse name: Not on file   Number of children: 3   Years of education: Not on file   Highest education level: Not on file  Occupational History    Employer: [employer]  Tobacco Use   Smoking status: Never   Smokeless tobacco: Never  Substance and Sexual Activity   Alcohol use: Yes    Alcohol/week: 0.0 standard drinks of alcohol   Drug use: No   Sexual activity: Not on file  Other Topics Concern   Not on file  Social History Narrative   Not on file   Social Drivers of Health   Financial Resource Strain: Not on file  Food Insecurity: Not on file  Transportation Needs: Not on file  Physical Activity: Not on file  Stress: Not on file  Social Connections: Not on file     Review of Systems  Constitutional:   Positive for fatigue. Negative for appetite change and unexpected weight change.  HENT:  Negative for congestion, sinus pressure and sore throat.   Eyes:  Negative for pain and visual disturbance.  Respiratory:  Negative for cough, chest tightness and shortness of breath.   Cardiovascular:  Negative for chest pain, palpitations and leg swelling.  Gastrointestinal:  Negative for abdominal pain, diarrhea, nausea and vomiting.  Genitourinary:  Negative for difficulty urinating and dysuria.  Musculoskeletal:  Negative for joint swelling and myalgias.  Skin:  Negative for color change and rash.  Neurological:  Negative for dizziness and headaches.  Hematological:  Negative for adenopathy. Does not bruise/bleed easily.  Psychiatric/Behavioral:  Negative for agitation and dysphoric mood.        Increased stress.        Objective:     BP 118/72   Pulse 63   Resp 16   Ht 5' 5 (1.651 m)   Wt 156 lb 6.4 oz (70.9 kg)   SpO2 98%   BMI 26.03 kg/m  Wt Readings from Last 3 Encounters:  09/19/23 156 lb 6.4 oz (70.9 kg)  03/21/23 157 lb 6.4 oz (71.4 kg)  02/13/23 154 lb 12.8 oz (70.2 kg)    Physical Exam Vitals reviewed.  Constitutional:  General: She is not in acute distress.    Appearance: Normal appearance. She is well-developed.  HENT:     Head: Normocephalic and atraumatic.     Right Ear: External ear normal.     Left Ear: External ear normal.     Mouth/Throat:     Pharynx: No oropharyngeal exudate or posterior oropharyngeal erythema.  Eyes:     General: No scleral icterus.       Right eye: No discharge.        Left eye: No discharge.     Conjunctiva/sclera: Conjunctivae normal.  Neck:     Thyroid : No thyromegaly.  Cardiovascular:     Rate and Rhythm: Normal rate and regular rhythm.  Pulmonary:     Effort: No tachypnea, accessory muscle usage or respiratory distress.     Breath sounds: Normal breath sounds. No decreased breath sounds or wheezing.  Chest:  Breasts:     Right: No inverted nipple, mass, nipple discharge or tenderness (no axillary adenopathy).     Left: No inverted nipple, mass, nipple discharge or tenderness (no axilarry adenopathy).  Abdominal:     General: Bowel sounds are normal.     Palpations: Abdomen is soft.     Tenderness: There is no abdominal tenderness.  Musculoskeletal:        General: No swelling or tenderness.     Cervical back: Neck supple.  Lymphadenopathy:     Cervical: No cervical adenopathy.  Skin:    Findings: No erythema or rash.  Neurological:     Mental Status: She is alert and oriented to person, place, and time.  Psychiatric:        Mood and Affect: Mood normal.        Behavior: Behavior normal.         Outpatient Encounter Medications as of 09/19/2023  Medication Sig   fluticasone  (FLONASE ) 50 MCG/ACT nasal spray Place 2 sprays into both nostrils daily.   telmisartan  (MICARDIS ) 40 MG tablet TAKE 1 TABLET(40 MG) BY MOUTH DAILY   [DISCONTINUED] telmisartan  (MICARDIS ) 40 MG tablet TAKE 1 TABLET(40 MG) BY MOUTH DAILY   No facility-administered encounter medications on file as of 09/19/2023.     Lab Results  Component Value Date   WBC 6.5 07/19/2023   HGB 13.9 07/19/2023   HCT 41.4 07/19/2023   PLT 220.0 07/19/2023   GLUCOSE 85 02/13/2023   CHOL 165 07/10/2022   TRIG 57.0 07/10/2022   HDL 58.40 07/10/2022   LDLCALC 95 07/10/2022   ALT 17 02/13/2023   AST 20 02/13/2023   NA 139 02/13/2023   K 4.0 02/13/2023   CL 106 02/13/2023   CREATININE 0.79 02/13/2023   BUN 23 02/13/2023   CO2 28 02/13/2023   TSH 1.35 02/13/2023       Assessment & Plan:  Routine general medical examination at a health care facility  Hypertension, essential Assessment & Plan: Continue micardis . Blood pressures as outlined. May be increased due to increase stress. Continue to spot check her pressures. Continue micardis . Hold on making any changes. Check metabolic panel.   Orders: -     CBC with Differential/Platelet;  Future -     Basic metabolic panel with GFR; Future -     TSH; Future  Screening cholesterol level -     Hepatic function panel; Future -     Lipid panel; Future  Health care maintenance Assessment & Plan: Physical today 09/19/23.  Mammogram 06/22/22 - [facility]. Due mammogram. Scheduled. Discussed due  colonoscopy. Notify when agreeable.  PAP 07/13/22 - negative with negative HPV.     Encounter for screening mammogram for malignant neoplasm of breast Assessment & Plan: Overdue mammogram. Schedule mammogram.   Orders: -     3D Screening Mammogram, Left and Right; Future  Other fatigue Assessment & Plan: Increased fatigue. Discussed. Probably multifactorial. Increased stress with her mother-n-law's health issues. Off iron. Check cbc and iron studies as well as routine labs.  Request am cortisol level to be checked.   Orders: -     Cortisol; Future  Iron deficiency Assessment & Plan: Off iron. Follow cbc and iron studies.   Orders: -     IBC + Ferritin; Future  Other orders -     Telmisartan ; TAKE 1 TABLET(40 MG) BY MOUTH DAILY  Dispense: 90 tablet; Refill: 1     Allena Hamilton, MD

## 2023-09-25 ENCOUNTER — Other Ambulatory Visit (INDEPENDENT_AMBULATORY_CARE_PROVIDER_SITE_OTHER)

## 2023-09-25 DIAGNOSIS — R5383 Other fatigue: Secondary | ICD-10-CM

## 2023-09-25 DIAGNOSIS — E611 Iron deficiency: Secondary | ICD-10-CM | POA: Diagnosis not present

## 2023-09-25 DIAGNOSIS — I10 Essential (primary) hypertension: Secondary | ICD-10-CM

## 2023-09-25 DIAGNOSIS — Z13220 Encounter for screening for lipoid disorders: Secondary | ICD-10-CM

## 2023-09-25 LAB — CBC WITH DIFFERENTIAL/PLATELET
Basophils Absolute: 0 K/uL (ref 0.0–0.1)
Basophils Relative: 0.4 % (ref 0.0–3.0)
Eosinophils Absolute: 0.1 K/uL (ref 0.0–0.7)
Eosinophils Relative: 2.3 % (ref 0.0–5.0)
HCT: 40.4 % (ref 36.0–46.0)
Hemoglobin: 13.5 g/dL (ref 12.0–15.0)
Lymphocytes Relative: 34.9 % (ref 12.0–46.0)
Lymphs Abs: 1.4 K/uL (ref 0.7–4.0)
MCHC: 33.3 g/dL (ref 30.0–36.0)
MCV: 84.7 fl (ref 78.0–100.0)
Monocytes Absolute: 0.3 K/uL (ref 0.1–1.0)
Monocytes Relative: 7.3 % (ref 3.0–12.0)
Neutro Abs: 2.3 K/uL (ref 1.4–7.7)
Neutrophils Relative %: 55.1 % (ref 43.0–77.0)
Platelets: 201 K/uL (ref 150.0–400.0)
RBC: 4.77 Mil/uL (ref 3.87–5.11)
RDW: 13.3 % (ref 11.5–15.5)
WBC: 4.1 K/uL (ref 4.0–10.5)

## 2023-09-25 LAB — LIPID PANEL
Cholesterol: 171 mg/dL (ref 0–200)
HDL: 54.8 mg/dL (ref >39.00)
LDL Cholesterol: 101 mg/dL — ABNORMAL HIGH (ref 0–99)
NonHDL: 116.25
Total CHOL/HDL Ratio: 3
Triglycerides: 74 mg/dL (ref 0.0–149.0)
VLDL: 14.8 mg/dL (ref 0.0–40.0)

## 2023-09-25 LAB — HEPATIC FUNCTION PANEL
ALT: 10 U/L (ref 0–35)
AST: 14 U/L (ref 0–37)
Albumin: 4.4 g/dL (ref 3.5–5.2)
Alkaline Phosphatase: 73 U/L (ref 39–117)
Bilirubin, Direct: 0.1 mg/dL (ref 0.0–0.3)
Total Bilirubin: 0.6 mg/dL (ref 0.2–1.2)
Total Protein: 6.9 g/dL (ref 6.0–8.3)

## 2023-09-25 LAB — IBC + FERRITIN
Ferritin: 50.9 ng/mL (ref 10.0–291.0)
Iron: 69 ug/dL (ref 42–145)
Saturation Ratios: 20.8 % (ref 20.0–50.0)
TIBC: 331.8 ug/dL (ref 250.0–450.0)
Transferrin: 237 mg/dL (ref 212.0–360.0)

## 2023-09-25 LAB — BASIC METABOLIC PANEL WITH GFR
BUN: 16 mg/dL (ref 6–23)
CO2: 33 meq/L — ABNORMAL HIGH (ref 19–32)
Calcium: 9.5 mg/dL (ref 8.4–10.5)
Chloride: 104 meq/L (ref 96–112)
Creatinine, Ser: 0.8 mg/dL (ref 0.40–1.20)
GFR: 83.45 mL/min (ref >60.00)
Glucose, Bld: 98 mg/dL (ref 70–99)
Potassium: 4.5 meq/L (ref 3.5–5.1)
Sodium: 141 meq/L (ref 135–145)

## 2023-09-25 LAB — TSH: TSH: 1.49 u[IU]/mL (ref 0.35–5.50)

## 2023-09-25 LAB — CORTISOL: Cortisol, Plasma: 13.5 ug/dL

## 2023-09-26 ENCOUNTER — Ambulatory Visit
Admission: RE | Admit: 2023-09-26 | Discharge: 2023-09-26 | Disposition: A | Discharge status: Home / Self Care | Source: Ambulatory Visit | Attending: Internal Medicine | Admitting: Internal Medicine

## 2023-09-26 DIAGNOSIS — Z1231 Encounter for screening mammogram for malignant neoplasm of breast: Secondary | ICD-10-CM | POA: Insufficient documentation

## 2023-09-27 ENCOUNTER — Ambulatory Visit: Payer: Self-pay | Admitting: Internal Medicine

## 2024-01-10 NOTE — Telephone Encounter (Signed)
 open in error

## 2024-03-21 ENCOUNTER — Encounter: Payer: Self-pay | Admitting: Internal Medicine

## 2024-03-21 ENCOUNTER — Ambulatory Visit: Admitting: Internal Medicine

## 2024-03-21 VITALS — BP 106/70 | HR 67 | Temp 97.5°F | Ht 65.0 in | Wt 154.4 lb

## 2024-03-21 DIAGNOSIS — F439 Reaction to severe stress, unspecified: Secondary | ICD-10-CM

## 2024-03-21 DIAGNOSIS — I10 Essential (primary) hypertension: Secondary | ICD-10-CM | POA: Diagnosis not present

## 2024-03-21 DIAGNOSIS — E611 Iron deficiency: Secondary | ICD-10-CM

## 2024-03-21 DIAGNOSIS — Z13220 Encounter for screening for lipoid disorders: Secondary | ICD-10-CM

## 2024-03-21 LAB — CBC WITH DIFFERENTIAL/PLATELET
Basophils Absolute: 0 10*3/uL (ref 0.0–0.1)
Basophils Relative: 0.4 % (ref 0.0–3.0)
Eosinophils Absolute: 0.1 10*3/uL (ref 0.0–0.7)
Eosinophils Relative: 1.8 % (ref 0.0–5.0)
HCT: 39.1 % (ref 36.0–46.0)
Hemoglobin: 13.3 g/dL (ref 12.0–15.0)
Lymphocytes Relative: 31.3 % (ref 12.0–46.0)
Lymphs Abs: 1.9 10*3/uL (ref 0.7–4.0)
MCHC: 34.1 g/dL (ref 30.0–36.0)
MCV: 83.5 fl (ref 78.0–100.0)
Monocytes Absolute: 0.4 10*3/uL (ref 0.1–1.0)
Monocytes Relative: 6.9 % (ref 3.0–12.0)
Neutro Abs: 3.7 10*3/uL (ref 1.4–7.7)
Neutrophils Relative %: 59.6 % (ref 43.0–77.0)
Platelets: 188 10*3/uL (ref 150.0–400.0)
RBC: 4.68 Mil/uL (ref 3.87–5.11)
RDW: 12.8 % (ref 11.5–15.5)
WBC: 6.2 10*3/uL (ref 4.0–10.5)

## 2024-03-21 LAB — BASIC METABOLIC PANEL WITH GFR
BUN: 21 mg/dL (ref 6–23)
CO2: 30 meq/L (ref 19–32)
Calcium: 9.2 mg/dL (ref 8.4–10.5)
Chloride: 103 meq/L (ref 96–112)
Creatinine, Ser: 0.79 mg/dL (ref 0.40–1.20)
GFR: 84.43 mL/min
Glucose, Bld: 93 mg/dL (ref 70–99)
Potassium: 4.1 meq/L (ref 3.5–5.1)
Sodium: 139 meq/L (ref 135–145)

## 2024-03-21 LAB — FERRITIN: Ferritin: 62.1 ng/mL (ref 10.0–291.0)

## 2024-03-21 MED ORDER — TELMISARTAN 40 MG PO TABS: ORAL_TABLET | ORAL | 1 refills | Status: AC

## 2024-03-21 NOTE — Progress Notes (Unsigned)
 "  Subjective:    Patient ID: Amanda Rojas, female    DOB: [birth year], 54 y.o.   MRN: [id number]  Patient here for  Chief Complaint  Patient presents with   Medical Management of Chronic Issues    HPI Here for a scheduled follow up - follow up regarding hypertension. Continues on micardis . Increased stress. Father recently passed unexpectedly. She has good support. Does not feel she needs further intervention at this itme. Stays active. Breathing stable. No sob. No cough or congestion. No abdominal pain or cramping.    Past Medical History:  Diagnosis Date   History of HPV infection    abnormal pap s/p cryosurgery   Hx of migraine headaches    menstrual migraines   Hypertension    Kidney stones    Past Surgical History:  Procedure Laterality Date   WRIST FRACTURE SURGERY  1996   metal plate placed   Family History  Problem Relation Age of Onset   Arthritis Mother    Thyroid  disease Mother    Breast cancer Daughter 18   Heart disease Maternal Grandmother    Breast cancer Cousin 35       subsequently developed acute leukemia   Prostate cancer Neg Hx    Bladder Cancer Neg Hx    Social History   Socioeconomic History   Marital status: Married    Spouse name: Not on file   Number of children: 3   Years of education: Not on file   Highest education level: Not on file  Occupational History    Employer: [employer]  Tobacco Use   Smoking status: Never   Smokeless tobacco: Never  Substance and Sexual Activity   Alcohol use: Yes    Alcohol/week: 0.0 standard drinks of alcohol   Drug use: No   Sexual activity: Not on file  Other Topics Concern   Not on file  Social History Narrative   Not on file   Social Drivers of Health   Tobacco Use: Low Risk (03/24/2024)   Patient History    Smoking Tobacco Use: Never    Smokeless Tobacco Use: Never    Passive Exposure: Not on file  Financial Resource Strain: Not on file  Food Insecurity: Not on file   Transportation Needs: Not on file  Physical Activity: Not on file  Stress: Not on file  Social Connections: Not on file  Depression (PHQ2-9): Low Risk (09/19/2023)   Depression (PHQ2-9)    PHQ-2 Score: 3  Alcohol Screen: Not on file  Housing: Not on file  Utilities: Not on file  Health Literacy: Not on file     Review of Systems  Constitutional:  Negative for appetite change and unexpected weight change.  HENT:  Negative for congestion and sinus pressure.   Respiratory:  Negative for cough, chest tightness and shortness of breath.   Cardiovascular:  Negative for chest pain, palpitations and leg swelling.  Gastrointestinal:  Negative for abdominal pain, diarrhea, nausea and vomiting.  Genitourinary:  Negative for difficulty urinating and dysuria.  Musculoskeletal:  Negative for joint swelling and myalgias.  Skin:  Negative for color change and rash.  Neurological:  Negative for dizziness and headaches.  Psychiatric/Behavioral:  Negative for agitation and dysphoric mood.        Objective:     BP 106/70   Pulse 67   Temp (!) 97.5 F (36.4 C) (Oral)   Ht 5' 5 (1.651 m)   Wt 154 lb 6.1 oz (70 kg)  SpO2 97%   BMI 25.69 kg/m  Wt Readings from Last 3 Encounters:  03/21/24 154 lb 6.1 oz (70 kg)  09/19/23 156 lb 6.4 oz (70.9 kg)  03/21/23 157 lb 6.4 oz (71.4 kg)    Physical Exam Vitals reviewed.  Constitutional:      General: She is not in acute distress.    Appearance: Normal appearance.  HENT:     Head: Normocephalic and atraumatic.     Right Ear: External ear normal.     Left Ear: External ear normal.     Mouth/Throat:     Pharynx: No oropharyngeal exudate or posterior oropharyngeal erythema.  Eyes:     General: No scleral icterus.       Right eye: No discharge.        Left eye: No discharge.     Conjunctiva/sclera: Conjunctivae normal.  Neck:     Thyroid : No thyromegaly.  Cardiovascular:     Rate and Rhythm: Normal rate and regular rhythm.  Pulmonary:      Effort: No respiratory distress.     Breath sounds: Normal breath sounds. No wheezing.  Abdominal:     General: Bowel sounds are normal.     Palpations: Abdomen is soft.     Tenderness: There is no abdominal tenderness.  Musculoskeletal:        General: No swelling or tenderness.     Cervical back: Neck supple. No tenderness.  Lymphadenopathy:     Cervical: No cervical adenopathy.  Skin:    Findings: No erythema or rash.  Neurological:     Mental Status: She is alert.  Psychiatric:        Mood and Affect: Mood normal.        Behavior: Behavior normal.         Outpatient Encounter Medications as of 03/21/2024  Medication Sig   fluticasone  (FLONASE ) 50 MCG/ACT nasal spray Place 2 sprays into both nostrils daily.   telmisartan  (MICARDIS ) 40 MG tablet TAKE 1 TABLET(40 MG) BY MOUTH DAILY   [DISCONTINUED] telmisartan  (MICARDIS ) 40 MG tablet TAKE 1 TABLET(40 MG) BY MOUTH DAILY   No facility-administered encounter medications on file as of 03/21/2024.     Lab Results  Component Value Date   WBC 6.2 03/21/2024   HGB 13.3 03/21/2024   HCT 39.1 03/21/2024   PLT 188.0 03/21/2024   GLUCOSE 93 03/21/2024   CHOL 171 09/25/2023   TRIG 74.0 09/25/2023   HDL 54.80 09/25/2023   LDLCALC 101 (H) 09/25/2023   ALT 10 09/25/2023   AST 14 09/25/2023   NA 139 03/21/2024   K 4.1 03/21/2024   CL 103 03/21/2024   CREATININE 0.79 03/21/2024   BUN 21 03/21/2024   CO2 30 03/21/2024   TSH 1.49 09/25/2023    MM 3D SCREENING MAMMOGRAM BILATERAL BREAST Result Date: 09/28/2023 CLINICAL DATA:  Screening. EXAM: DIGITAL SCREENING BILATERAL MAMMOGRAM WITH TOMOSYNTHESIS AND CAD TECHNIQUE: Bilateral screening digital craniocaudal and mediolateral oblique mammograms were obtained. Bilateral screening digital breast tomosynthesis was performed. The images were evaluated with computer-aided detection. COMPARISON:  Previous exam(s). ACR Breast Density Category c: The breasts are heterogeneously dense, which  may obscure small masses. FINDINGS: There are no findings suspicious for malignancy. There are multiple round and oval masses which have waxed and waned since prior exam. These are most consistent with benign cysts. IMPRESSION: No mammographic evidence of malignancy. A result letter of this screening mammogram will be mailed directly to the patient. RECOMMENDATION: Screening mammogram in one year. (  Code:SM-B-01Y) BI-RADS CATEGORY  2: Benign. Electronically Signed   By: Corean Salter M.D.   On: 09/28/2023 13:03       Assessment & Plan:  Iron deficiency Assessment & Plan: Off iron. Check cbc and iron studies.   Orders: -     CBC with Differential/Platelet -     Ferritin  Hypertension, essential Assessment & Plan: Continue micardis . Blood pressures as outlined. Overall appears to be doing well. Check metabolic panel. No change in medication.   Orders: -     Basic metabolic panel with GFR  Screening cholesterol level  Stress Assessment & Plan: Increased stress as outlined. Has good support. Will notify if feels needs further intervention.    Other orders -     Telmisartan ; TAKE 1 TABLET(40 MG) BY MOUTH DAILY  Dispense: 90 tablet; Refill: 1     Allena Hamilton, MD "

## 2024-03-22 ENCOUNTER — Ambulatory Visit: Payer: Self-pay | Admitting: Internal Medicine

## 2024-03-24 ENCOUNTER — Encounter: Payer: Self-pay | Admitting: Internal Medicine

## 2024-03-24 DIAGNOSIS — F439 Reaction to severe stress, unspecified: Secondary | ICD-10-CM | POA: Insufficient documentation

## 2024-03-24 NOTE — Assessment & Plan Note (Signed)
 Increased stress as outlined. Has good support. Will notify if feels needs further intervention.

## 2024-03-24 NOTE — Assessment & Plan Note (Signed)
Off iron.  Check cbc and iron studies.
